# Patient Record
Sex: Male | Born: 1995 | Race: Black or African American | Hispanic: No | Marital: Single | Smoking: Never smoker
Health system: Southern US, Community
[De-identification: ages and names within clinical notes are randomized; demographics above are authoritative.]

---
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
## 2015-09-02 ENCOUNTER — Ambulatory Visit (INDEPENDENT_AMBULATORY_CARE_PROVIDER_SITE_OTHER): Payer: BLUE CROSS/BLUE SHIELD | Admitting: Medical

## 2015-09-02 ENCOUNTER — Encounter: Payer: Self-pay | Admitting: Medical

## 2015-09-02 ENCOUNTER — Other Ambulatory Visit (HOSPITAL_COMMUNITY)
Admission: RE | Admit: 2015-09-02 | Discharge: 2015-09-02 | Disposition: A | Discharge status: Home / Self Care | Payer: BLUE CROSS/BLUE SHIELD | Source: Ambulatory Visit | Attending: Medical | Admitting: Medical

## 2015-09-02 VITALS — BP 116/76 | HR 58 | Temp 98.0°F | Resp 16 | Ht 73.5 in | Wt 193.0 lb

## 2015-09-02 DIAGNOSIS — Z0000 Encounter for general adult medical examination without abnormal findings: Secondary | ICD-10-CM | POA: Diagnosis not present

## 2015-09-02 DIAGNOSIS — Z113 Encounter for screening for infections with a predominantly sexual mode of transmission: Secondary | ICD-10-CM | POA: Insufficient documentation

## 2015-09-02 LAB — POC URINALSYSI DIPSTICK (AUTOMATED)
Bilirubin, UA: NEGATIVE
Blood, UA: NEGATIVE
Glucose, UA: NEGATIVE
Ketones, UA: NEGATIVE
Leukocytes, UA: NEGATIVE
Nitrite, UA: NEGATIVE
Protein, UA: NEGATIVE
Spec Grav, UA: >=1.03
Urobilinogen, UA: 1
pH, UA: 6

## 2015-09-02 LAB — COMPREHENSIVE METABOLIC PANEL
ALT: 29 U/L (ref 0–53)
AST: 23 U/L (ref 0–37)
Albumin: 4.4 g/dL (ref 3.5–5.2)
Alkaline Phosphatase: 45 U/L — ABNORMAL LOW (ref 52–171)
BUN: 19 mg/dL (ref 6–23)
CO2: 30 mEq/L (ref 19–32)
Calcium: 9.4 mg/dL (ref 8.4–10.5)
Chloride: 102 mEq/L (ref 96–112)
Creatinine, Ser: 1.16 mg/dL (ref 0.40–1.50)
GFR: 103.27 mL/min (ref >60.00)
Glucose, Bld: 71 mg/dL (ref 70–99)
Potassium: 3.9 mEq/L (ref 3.5–5.1)
Sodium: 139 mEq/L (ref 135–145)
Total Bilirubin: 0.8 mg/dL (ref 0.2–1.2)
Total Protein: 7.1 g/dL (ref 6.0–8.3)

## 2015-09-02 LAB — CBC WITH DIFFERENTIAL/PLATELET
Basophils Absolute: 0 10*3/uL (ref 0.0–0.1)
Basophils Relative: 0.6 % (ref 0.0–3.0)
Eosinophils Absolute: 0.2 10*3/uL (ref 0.0–0.7)
Eosinophils Relative: 4.6 % (ref 0.0–5.0)
HCT: 44.8 % (ref 36.0–49.0)
Hemoglobin: 15.1 g/dL (ref 12.0–16.0)
Lymphocytes Relative: 38.2 % (ref 24.0–48.0)
Lymphs Abs: 1.7 10*3/uL (ref 0.7–4.0)
MCHC: 33.9 g/dL (ref 31.0–37.0)
MCV: 87 fl (ref 78.0–98.0)
Monocytes Absolute: 0.5 10*3/uL (ref 0.1–1.0)
Monocytes Relative: 11.1 % (ref 3.0–12.0)
Neutro Abs: 2 10*3/uL (ref 1.4–7.7)
Neutrophils Relative %: 45.5 % (ref 43.0–71.0)
Platelets: 252 10*3/uL (ref 150.0–575.0)
RBC: 5.14 Mil/uL (ref 3.80–5.70)
RDW: 14.1 % (ref 11.4–15.5)
WBC: 4.5 10*3/uL (ref 4.5–13.5)

## 2015-09-02 LAB — LIPID PANEL
Cholesterol: 156 mg/dL (ref 0–200)
HDL: 41.6 mg/dL (ref >39.00)
LDL Cholesterol: 85 mg/dL (ref 0–99)
NonHDL: 114.16
Total CHOL/HDL Ratio: 4
Triglycerides: 146 mg/dL (ref 0.0–149.0)
VLDL: 29.2 mg/dL (ref 0.0–40.0)

## 2015-09-02 LAB — TSH: TSH: 2.06 u[IU]/mL (ref 0.40–5.00)

## 2015-09-02 NOTE — Addendum Note (Signed)
Addended by: Eustace QuailEABOLD, Skyelar Swigart J on: 09/02/2015 09:49 AM   Modules accepted: Kipp BroodSmartSet

## 2015-09-02 NOTE — Assessment & Plan Note (Signed)
Will get cbc, cmp, tsh, lipid panel, ua and std screening test. You stopped eating red meat and pork this past year and then restarted this summer. Not bad idea to eliminate those food again. Healthy diet more fruit/vegatables and exercise recommended.

## 2015-09-02 NOTE — Progress Notes (Signed)
Pre visit review using our clinic review tool, if applicable. No additional management support is needed unless otherwise documented below in the visit note. 

## 2015-09-02 NOTE — Patient Instructions (Addendum)
Wellness examination Will get cbc, cmp, tsh, lipid panel, ua and std screening test. You stopped eating red meat and pork this past year and then restarted this summer. Not bad idea to eliminate those food again. Healthy diet more fruit/vegatables and exercise recommended.      Preventive Care for Adults, Male A healthy lifestyle and preventive care can promote health and wellness. Preventive health guidelines for men include the following key practices:  A routine yearly physical is a good way to check with your health care provider about your health and preventative screening. It is a chance to share any concerns and updates on your health and to receive a thorough exam.  Visit your dentist for a routine exam and preventative care every 6 months. Brush your teeth twice a day and floss once a day. Good oral hygiene prevents tooth decay and gum disease.  The frequency of eye exams is based on your age, health, family medical history, use of contact lenses, and other factors. Follow your health care provider's recommendations for frequency of eye exams.  Eat a healthy diet. Foods such as vegetables, fruits, whole grains, low-fat dairy products, and lean protein foods contain the nutrients you need without too many calories. Decrease your intake of foods high in solid fats, added sugars, and salt. Eat the right amount of calories for you.Get information about a proper diet from your health care provider, if necessary.  Regular physical exercise is one of the most important things you can do for your health. Most adults should get at least 150 minutes of moderate-intensity exercise (any activity that increases your heart rate and causes you to sweat) each week. In addition, most adults need muscle-strengthening exercises on 2 or more days a week.  Maintain a healthy weight. The body mass index (BMI) is a screening tool to identify possible weight problems. It provides an estimate of body fat based  on height and weight. Your health care provider can find your BMI and can help you achieve or maintain a healthy weight.For adults 20 years and older:  A BMI below 18.5 is considered underweight.  A BMI of 18.5 to 24.9 is normal.  A BMI of 25 to 29.9 is considered overweight.  A BMI of 30 and above is considered obese.  Maintain normal blood lipids and cholesterol levels by exercising and minimizing your intake of saturated fat. Eat a balanced diet with plenty of fruit and vegetables. Blood tests for lipids and cholesterol should begin at age 10 and be repeated every 5 years. If your lipid or cholesterol levels are high, you are over 50, or you are at high risk for heart disease, you may need your cholesterol levels checked more frequently.Ongoing high lipid and cholesterol levels should be treated with medicines if diet and exercise are not working.  If you smoke, find out from your health care provider how to quit. If you do not use tobacco, do not start.  Lung cancer screening is recommended for adults aged 40-80 years who are at high risk for developing lung cancer because of a history of smoking. A yearly low-dose CT scan of the lungs is recommended for people who have at least a 30-pack-year history of smoking and are a current smoker or have quit within the past 15 years. A pack year of smoking is smoking an average of 1 pack of cigarettes a day for 1 year (for example: 1 pack a day for 30 years or 2 packs a day for  15 years). Yearly screening should continue until the smoker has stopped smoking for at least 15 years. Yearly screening should be stopped for people who develop a health problem that would prevent them from having lung cancer treatment.  If you choose to drink alcohol, do not have more than 2 drinks per day. One drink is considered to be 12 ounces (355 mL) of beer, 5 ounces (148 mL) of wine, or 1.5 ounces (44 mL) of liquor.  Avoid use of street drugs. Do not share needles with  anyone. Ask for help if you need support or instructions about stopping the use of drugs.  High blood pressure causes heart disease and increases the risk of stroke. Your blood pressure should be checked at least every 1-2 years. Ongoing high blood pressure should be treated with medicines, if weight loss and exercise are not effective.  If you are 68-61 years old, ask your health care provider if you should take aspirin to prevent heart disease.  Diabetes screening is done by taking a blood sample to check your blood glucose level after you have not eaten for a certain period of time (fasting). If you are not overweight and you do not have risk factors for diabetes, you should be screened once every 3 years starting at age 65. If you are overweight or obese and you are 47-61 years of age, you should be screened for diabetes every year as part of your cardiovascular risk assessment.  Colorectal cancer can be detected and often prevented. Most routine colorectal cancer screening begins at the age of 64 and continues through age 69. However, your health care provider may recommend screening at an earlier age if you have risk factors for colon cancer. On a yearly basis, your health care provider may provide home test kits to check for hidden blood in the stool. Use of a small camera at the end of a tube to directly examine the colon (sigmoidoscopy or colonoscopy) can detect the earliest forms of colorectal cancer. Talk to your health care provider about this at age 66, when routine screening begins. Direct exam of the colon should be repeated every 5-10 years through age 13, unless early forms of precancerous polyps or small growths are found.  People who are at an increased risk for hepatitis B should be screened for this virus. You are considered at high risk for hepatitis B if:  You were born in a country where hepatitis B occurs often. Talk with your health care provider about which countries are  considered high risk.  Your parents were born in a high-risk country and you have not received a shot to protect against hepatitis B (hepatitis B vaccine).  You have HIV or AIDS.  You use needles to inject street drugs.  You live with, or have sex with, someone who has hepatitis B.  You are a man who has sex with other men (MSM).  You get hemodialysis treatment.  You take certain medicines for conditions such as cancer, organ transplantation, and autoimmune conditions.  Hepatitis C blood testing is recommended for all people born from [birth year] through 1965 and any individual with known risks for hepatitis C.  Practice safe sex. Use condoms and avoid high-risk sexual practices to reduce the spread of sexually transmitted infections (STIs). STIs include gonorrhea, chlamydia, syphilis, trichomonas, herpes, HPV, and human immunodeficiency virus (HIV). Herpes, HIV, and HPV are viral illnesses that have no cure. They can result in disability, cancer, and death.  If you are  a man who has sex with other men, you should be screened at least once per year for:  HIV.  Urethral, rectal, and pharyngeal infection of gonorrhea, chlamydia, or both.  If you are at risk of being infected with HIV, it is recommended that you take a prescription medicine daily to prevent HIV infection. This is called preexposure prophylaxis (PrEP). You are considered at risk if:  You are a man who has sex with other men (MSM) and have other risk factors.  You are a heterosexual man, are sexually active, and are at increased risk for HIV infection.  You take drugs by injection.  You are sexually active with a partner who has HIV.  Talk with your health care provider about whether you are at high risk of being infected with HIV. If you choose to begin PrEP, you should first be tested for HIV. You should then be tested every 3 months for as long as you are taking PrEP.  A one-time screening for abdominal aortic aneurysm  (AAA) and surgical repair of large AAAs by ultrasound are recommended for men ages 33 to 108 years who are current or former smokers.  Healthy men should no longer receive prostate-specific antigen (PSA) blood tests as part of routine cancer screening. Talk with your health care provider about prostate cancer screening.  Testicular cancer screening is not recommended for adult males who have no symptoms. Screening includes self-exam, a health care provider exam, and other screening tests. Consult with your health care provider about any symptoms you have or any concerns you have about testicular cancer.  Use sunscreen. Apply sunscreen liberally and repeatedly throughout the day. You should seek shade when your shadow is shorter than you. Protect yourself by wearing long sleeves, pants, a wide-brimmed hat, and sunglasses year round, whenever you are outdoors.  Once a month, do a whole-body skin exam, using a mirror to look at the skin on your back. Tell your health care provider about new moles, moles that have irregular borders, moles that are larger than a pencil eraser, or moles that have changed in shape or color.  Stay current with required vaccines (immunizations).  Influenza vaccine. All adults should be immunized every year.  Tetanus, diphtheria, and acellular pertussis (Td, Tdap) vaccine. An adult who has not previously received Tdap or who does not know his vaccine status should receive 1 dose of Tdap. This initial dose should be followed by tetanus and diphtheria toxoids (Td) booster doses every 10 years. Adults with an unknown or incomplete history of completing a 3-dose immunization series with Td-containing vaccines should begin or complete a primary immunization series including a Tdap dose. Adults should receive a Td booster every 10 years.  Varicella vaccine. An adult without evidence of immunity to varicella should receive 2 doses or a second dose if he has previously received 1  dose.  Human papillomavirus (HPV) vaccine. Males aged 11-21 years who have not received the vaccine previously should receive the 3-dose series. Males aged 22-26 years may be immunized. Immunization is recommended through the age of 70 years for any male who has sex with males and did not get any or all doses earlier. Immunization is recommended for any person with an immunocompromised condition through the age of 93 years if he did not get any or all doses earlier. During the 3-dose series, the second dose should be obtained 4-8 weeks after the first dose. The third dose should be obtained 24 weeks after the first dose  and 16 weeks after the second dose.  Zoster vaccine. One dose is recommended for adults aged 50 years or older unless certain conditions are present.  Measles, mumps, and rubella (MMR) vaccine. Adults born before [birth year] generally are considered immune to measles and mumps. Adults born in [birth year] or later should have 1 or more doses of MMR vaccine unless there is a contraindication to the vaccine or there is laboratory evidence of immunity to each of the three diseases. A routine second dose of MMR vaccine should be obtained at least 28 days after the first dose for students attending postsecondary schools, health care workers, or international travelers. People who received inactivated measles vaccine or an unknown type of measles vaccine during 1963-1967 should receive 2 doses of MMR vaccine. People who received inactivated mumps vaccine or an unknown type of mumps vaccine before 1979 and are at high risk for mumps infection should consider immunization with 2 doses of MMR vaccine. Unvaccinated health care workers born before [birth year] who lack laboratory evidence of measles, mumps, or rubella immunity or laboratory confirmation of disease should consider measles and mumps immunization with 2 doses of MMR vaccine or rubella immunization with 1 dose of MMR vaccine.  Pneumococcal 13-valent conjugate  (PCV13) vaccine. When indicated, a person who is uncertain of his immunization history and has no record of immunization should receive the PCV13 vaccine. All adults 44 years of age and older should receive this vaccine. An adult aged 48 years or older who has certain medical conditions and has not been previously immunized should receive 1 dose of PCV13 vaccine. This PCV13 should be followed with a dose of pneumococcal polysaccharide (PPSV23) vaccine. Adults who are at high risk for pneumococcal disease should obtain the PPSV23 vaccine at least 8 weeks after the dose of PCV13 vaccine. Adults older than 20 years of age who have normal immune system function should obtain the PPSV23 vaccine dose at least 1 year after the dose of PCV13 vaccine.  Pneumococcal polysaccharide (PPSV23) vaccine. When PCV13 is also indicated, PCV13 should be obtained first. All adults aged 70 years and older should be immunized. An adult younger than age 16 years who has certain medical conditions should be immunized. Any person who resides in a nursing home or long-term care facility should be immunized. An adult smoker should be immunized. People with an immunocompromised condition and certain other conditions should receive both PCV13 and PPSV23 vaccines. People with human immunodeficiency virus (HIV) infection should be immunized as soon as possible after diagnosis. Immunization during chemotherapy or radiation therapy should be avoided. Routine use of PPSV23 vaccine is not recommended for American Indians, [place] Natives, or people younger than 65 years unless there are medical conditions that require PPSV23 vaccine. When indicated, people who have unknown immunization and have no record of immunization should receive PPSV23 vaccine. One-time revaccination 5 years after the first dose of PPSV23 is recommended for people aged 19-64 years who have chronic kidney failure, nephrotic syndrome, asplenia, or immunocompromised conditions.  People who received 1-2 doses of PPSV23 before age 13 years should receive another dose of PPSV23 vaccine at age 2 years or later if at least 5 years have passed since the previous dose. Doses of PPSV23 are not needed for people immunized with PPSV23 at or after age 75 years.  Meningococcal vaccine. Adults with asplenia or persistent complement component deficiencies should receive 2 doses of quadrivalent meningococcal conjugate (MenACWY-D) vaccine. The doses should be obtained at least 2 months apart. Microbiologists working  with certain meningococcal bacteria, [employer], people at risk during an outbreak, and people who travel to or live in countries with a high rate of meningitis should be immunized. A first-year college student up through age 35 years who is living in a residence hall should receive a dose if he did not receive a dose on or after his 16th birthday. Adults who have certain high-risk conditions should receive one or more doses of vaccine.  Hepatitis A vaccine. Adults who wish to be protected from this disease, have chronic liver disease, work with hepatitis A-infected animals, work in hepatitis A research labs, or travel to or work in countries with a high rate of hepatitis A should be immunized. Adults who were previously unvaccinated and who anticipate close contact with an international adoptee during the first 60 days after arrival in the [place] from a country with a high rate of hepatitis A should be immunized.  Hepatitis B vaccine. Adults should be immunized if they wish to be protected from this disease, are under age 22 years and have diabetes, have chronic liver disease, have had more than one sex partner in the past 6 months, may be exposed to blood or other infectious body fluids, are household contacts or sex partners of hepatitis B positive people, are clients or workers in certain care facilities, or travel to or work in countries with a high rate of hepatitis  B.  Haemophilus influenzae type b (Hib) vaccine. A previously unvaccinated person with asplenia or sickle cell disease or having a scheduled splenectomy should receive 1 dose of Hib vaccine. Regardless of previous immunization, a recipient of a hematopoietic stem cell transplant should receive a 3-dose series 6-12 months after his successful transplant. Hib vaccine is not recommended for adults with HIV infection. Preventive Service / Frequency Ages 57 to 60  Blood pressure check.** / Every 3-5 years.  Lipid and cholesterol check.** / Every 5 years beginning at age 58.  Hepatitis C blood test.** / For any individual with known risks for hepatitis C.  Skin self-exam. / Monthly.  Influenza vaccine. / Every year.  Tetanus, diphtheria, and acellular pertussis (Tdap, Td) vaccine.** / Consult your health care provider. 1 dose of Td every 10 years.  Varicella vaccine.** / Consult your health care provider.  HPV vaccine. / 3 doses over 6 months, if 53 or younger.  Measles, mumps, rubella (MMR) vaccine.** / You need at least 1 dose of MMR if you were born in 1957 or later. You may also need a second dose.  Pneumococcal 13-valent conjugate (PCV13) vaccine.** / Consult your health care provider.  Pneumococcal polysaccharide (PPSV23) vaccine.** / 1 to 2 doses if you smoke cigarettes or if you have certain conditions.  Meningococcal vaccine.** / 1 dose if you are age 90 to 4 years and a Market researcher living in a residence hall, or have one of several medical conditions. You may also need additional booster doses.  Hepatitis A vaccine.** / Consult your health care provider.  Hepatitis B vaccine.** / Consult your health care provider.  Haemophilus influenzae type b (Hib) vaccine.** / Consult your health care provider. Ages 81 to 29  Blood pressure check.** / Every year.  Lipid and cholesterol check.** / Every 5 years beginning at age 53.  Lung cancer screening. / Every year if  you are aged 69-80 years and have a 30-pack-year history of smoking and currently smoke or have quit within the past 15 years. Yearly screening is stopped once  you have quit smoking for at least 15 years or develop a health problem that would prevent you from having lung cancer treatment.  Fecal occult blood test (FOBT) of stool. / Every year beginning at age 59 and continuing until age 50. You may not have to do this test if you get a colonoscopy every 10 years.  Flexible sigmoidoscopy** or colonoscopy.** / Every 5 years for a flexible sigmoidoscopy or every 10 years for a colonoscopy beginning at age 37 and continuing until age 51.  Hepatitis C blood test.** / For all people born from [birth year] through 1965 and any individual with known risks for hepatitis C.  Skin self-exam. / Monthly.  Influenza vaccine. / Every year.  Tetanus, diphtheria, and acellular pertussis (Tdap/Td) vaccine.** / Consult your health care provider. 1 dose of Td every 10 years.  Varicella vaccine.** / Consult your health care provider.  Zoster vaccine.** / 1 dose for adults aged 20 years or older.  Measles, mumps, rubella (MMR) vaccine.** / You need at least 1 dose of MMR if you were born in 1957 or later. You may also need a second dose.  Pneumococcal 13-valent conjugate (PCV13) vaccine.** / Consult your health care provider.  Pneumococcal polysaccharide (PPSV23) vaccine.** / 1 to 2 doses if you smoke cigarettes or if you have certain conditions.  Meningococcal vaccine.** / Consult your health care provider.  Hepatitis A vaccine.** / Consult your health care provider.  Hepatitis B vaccine.** / Consult your health care provider.  Haemophilus influenzae type b (Hib) vaccine.** / Consult your health care provider. Ages 91 and over  Blood pressure check.** / Every year.  Lipid and cholesterol check.**/ Every 5 years beginning at age 54.  Lung cancer screening. / Every year if you are aged 50-80 years and have a  30-pack-year history of smoking and currently smoke or have quit within the past 15 years. Yearly screening is stopped once you have quit smoking for at least 15 years or develop a health problem that would prevent you from having lung cancer treatment.  Fecal occult blood test (FOBT) of stool. / Every year beginning at age 80 and continuing until age 25. You may not have to do this test if you get a colonoscopy every 10 years.  Flexible sigmoidoscopy** or colonoscopy.** / Every 5 years for a flexible sigmoidoscopy or every 10 years for a colonoscopy beginning at age 69 and continuing until age 31.  Hepatitis C blood test.** / For all people born from [birth year] through 1965 and any individual with known risks for hepatitis C.  Abdominal aortic aneurysm (AAA) screening.** / A one-time screening for ages 28 to 21 years who are current or former smokers.  Skin self-exam. / Monthly.  Influenza vaccine. / Every year.  Tetanus, diphtheria, and acellular pertussis (Tdap/Td) vaccine.** / 1 dose of Td every 10 years.  Varicella vaccine.** / Consult your health care provider.  Zoster vaccine.** / 1 dose for adults aged 46 years or older.  Pneumococcal 13-valent conjugate (PCV13) vaccine.** / 1 dose for all adults aged 5 years and older.  Pneumococcal polysaccharide (PPSV23) vaccine.** / 1 dose for all adults aged 39 years and older.  Meningococcal vaccine.** / Consult your health care provider.  Hepatitis A vaccine.** / Consult your health care provider.  Hepatitis B vaccine.** / Consult your health care provider.  Haemophilus influenzae type b (Hib) vaccine.** / Consult your health care provider. **Family history and personal history of risk and conditions may change your health care  provider's recommendations.   This information is not intended to replace advice given to you by your health care provider. Make sure you discuss any questions you have with your health care provider.   Document  Released: 03/29/2001 Document Revised: 02/21/2014 Document Reviewed: 06/28/2010 Elsevier Interactive Patient Education [employer].

## 2015-09-02 NOTE — Addendum Note (Signed)
Addended by: Neldon LabellaMABE, Gadiel John S on: 09/02/2015 10:04 AM   Modules accepted: Orders, SmartSet

## 2015-09-02 NOTE — Addendum Note (Signed)
Addended by: Neldon LabellaMABE, HOLDEN S on: 09/02/2015 09:43 AM   Modules accepted: Kipp BroodSmartSet

## 2015-09-02 NOTE — Progress Notes (Signed)
Subjective:    Patient ID: Scott Cantrell, male    DOB: 05/06/1995, 20 y.o.   MRN: [id number]  HPI  I have reviewed pt PMH, PSH, FH, Social History and Surgical History.  Pt states needs check up before school at [employer]. 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 Plays basketball 2 days a week. Pt for a while stopped red meat and pork. Then he restarted beef and pork over summer. Pt soda on occasional at school when needs to stay up for school. Pt does not smoke. No drugs. No alcohol.  Pt not fasting.  HIV screening test may have been done at school. But he is not sure. Pt has one partner. Sometimes where condoms.   Review of Systems  Constitutional: Negative for fever, chills and fatigue.  HENT: Negative for congestion, drooling, ear pain, hearing loss, mouth sores, nosebleeds, postnasal drip, sinus pressure and sneezing.   Respiratory: Negative for cough, chest tightness, shortness of breath and wheezing.   Cardiovascular: Negative for chest pain and palpitations.  Gastrointestinal: Negative for abdominal pain.  Genitourinary: Negative for dysuria, scrotal swelling, difficulty urinating and penile pain.  Musculoskeletal: Negative for back pain.  Skin: Negative for rash.  Neurological: Negative for dizziness and headaches.  Hematological: Negative for adenopathy. Does not bruise/bleed easily.  Psychiatric/Behavioral: Negative for confusion, dysphoric mood and decreased concentration.     No past medical history on file.   Social History   Social History  . Marital Status: Single    Spouse Name: N/A  . Number of Children: N/A  . Years of Education: N/A   Occupational History  . Not on file.   Social History Main Topics  . Smoking status: Never Smoker   . Smokeless tobacco: Not on file  . Alcohol Use: No  . Drug Use: No  . Sexual Activity: Not on file   Other Topics Concern  . Not on file   Social History Narrative   Lives with mom and  older sister. Dad is involved.   Likes basketball.    No past surgical history on file.  Family History  Problem Relation Age of Onset  . Hypertension Mother     No Known Allergies  Current Outpatient Prescriptions on File Prior to Visit  Medication Sig Dispense Refill  . cetirizine (ZYRTEC) 10 MG tablet Take 10 mg by mouth daily.     No current facility-administered medications on file prior to visit.    BP 116/76 mmHg  Pulse 58  Temp(Src) 98 F (36.7 C) (Oral)  Resp 16  Ht 6' 1.5" (1.867 m)  Wt 193 lb (87.544 kg)  BMI 25.12 kg/m2  SpO2 98%       Objective:   Physical Exam  General Mental Status- Alert. General Appearance- Not in acute distress.   Skin General: Color- Normal Color. Moisture- Normal Moisture.  Neck Carotid Arteries- Normal color. Moisture- Normal Moisture. No carotid bruits. No JVD.  Chest and Lung Exam Auscultation: Breath Sounds:-Normal.  Cardiovascular Auscultation:Rythm- Regular. Murmurs & Other Heart Sounds:Auscultation of the heart reveals- No Murmurs.  Abdomen Inspection:-Inspeection Normal. Palpation/Percussion:Note:No mass. Palpation and Percussion of the abdomen reveal- Non Tender, Non Distended + BS, no rebound or guarding.  Neurologic Cranial Nerve exam:- CN III-XII intact(No nystagmus), symmetric smile. Strength:- 5/5 equal and symmetric strength both upper and lower extremities.  Genital- on exam. No hernia felt in inguinal canals. No lesions on penis. No rash.      Assessment & Plan:  Wellness examination  Will get cbc, cmp, tsh, lipid panel, ua and std screening test. You stopped eating red meat and pork this past year and then restarted this summer. Not bad idea to eliminate those food again. Healthy diet more fruit/vegatables and exercise recommended.     Pt notified will call him on lab results.  Follow up date to be determined after lab review.

## 2015-09-03 LAB — RPR

## 2015-09-03 LAB — URINE CYTOLOGY ANCILLARY ONLY
Chlamydia: NEGATIVE
Neisseria Gonorrhea: NEGATIVE
Trichomonas: NEGATIVE

## 2015-09-03 LAB — HIV ANTIBODY (ROUTINE TESTING W REFLEX): HIV 1&2 Ab, 4th Generation: NONREACTIVE

## 2015-09-03 NOTE — Addendum Note (Signed)
Addended by: Neldon LabellaMABE, Kahlil Cowans S on: 09/03/2015 03:08 PM   Modules accepted: K[employer]od[employer]Set

## 2015-09-03 NOTE — Addendum Note (Signed)
Addended by: Neldon LabellaMABE, HOLDEN S on: 09/03/2015 02:26 PM   Modules accepted: [employer]Set

## 2015-09-04 NOTE — Addendum Note (Signed)
Addended by: Neldon LabellaMABE, HOLDEN S on: 09/04/2015 11:47 AM   Modules accepted: [employer]od[employer]Set

## 2015-09-04 NOTE — Addendum Note (Signed)
Addended by: Neldon LabellaMABE, Keiry Kowal S on: 09/04/2015 02:59 PM   Modules accepted: Kipp [employer]odSmartSet

## 2015-12-15 DIAGNOSIS — L308 Other specified dermatitis: Secondary | ICD-10-CM | POA: Diagnosis not present

## 2016-04-29 ENCOUNTER — Ambulatory Visit (INDEPENDENT_AMBULATORY_CARE_PROVIDER_SITE_OTHER): Payer: BLUE CROSS/BLUE SHIELD | Admitting: Medical

## 2016-04-29 ENCOUNTER — Other Ambulatory Visit (HOSPITAL_COMMUNITY)
Admission: RE | Admit: 2016-04-29 | Discharge: 2016-04-29 | Disposition: A | Discharge status: Home / Self Care | Payer: BLUE CROSS/BLUE SHIELD | Source: Ambulatory Visit | Attending: Medical | Admitting: Medical

## 2016-04-29 VITALS — BP 123/64 | HR 50 | Temp 97.9°F | Ht 73.5 in | Wt 198.4 lb

## 2016-04-29 DIAGNOSIS — Z113 Encounter for screening for infections with a predominantly sexual mode of transmission: Secondary | ICD-10-CM | POA: Diagnosis not present

## 2016-04-29 DIAGNOSIS — L739 Follicular disorder, unspecified: Secondary | ICD-10-CM | POA: Diagnosis not present

## 2016-04-29 MED ORDER — DOXYCYCLINE HYCLATE 100 MG PO TABS: 100.0000 mg | ORAL_TABLET | Freq: Two times a day (BID) | ORAL | 0 refills | Status: DC

## 2016-04-29 NOTE — Progress Notes (Signed)
   Subjective:    Patient ID: Scott Cantrell, male    DOB: 07/25/1995, 20 y.o.   MRN: [id number]  HPI  Pt in with some mild bumps in his suprapubic area after he shaved. Not tender but noticed.  No penis lesions. No dc. Pt does have general concern for std. States while since he has been checked. No partner report of any std.   Review of Systems  Constitutional: Negative for chills, fatigue and fever.  Respiratory: Negative for cough, chest tightness, shortness of breath and wheezing.   Cardiovascular: Negative for chest pain and palpitations.  Genitourinary: Negative for dysuria and enuresis.  Musculoskeletal: Negative for back pain.  Skin: Negative for rash.  Neurological: Negative for dizziness and headaches.  Hematological: Negative for adenopathy. Does not bruise/bleed easily.  Psychiatric/Behavioral: Negative for behavioral problems and confusion.   No past medical history on file.   Social History   Social History  . Marital status: Single    Spouse name: N/A  . Number of children: N/A  . Years of education: N/A   Occupational History  . Not on file.   Social History Main Topics  . Smoking status: Never Smoker  . Smokeless tobacco: Not on file  . Alcohol use No  . Drug use: No  . Sexual activity: Not on file   Other Topics Concern  . Not on file   Social History Narrative   Lives with mom and older sister. Dad is involved.   Likes basketball.    No past surgical history on file.  Family History  Problem Relation Age of Onset  . Hypertension Mother     No Known Allergies  No current outpatient prescriptions on file prior to visit.   No current facility-administered medications on file prior to visit.     BP 123/64   Pulse (!) 50   Temp 97.9 F (36.6 C) (Oral)   Ht 6' 1.5" (1.867 m)   Wt 198 lb 6.4 oz (90 kg)   SpO2 94%   BMI 25.82 kg/m       Objective:   Physical Exam  General- No acute distress. Pleasant patient. Neck- Full range  of motion, no jvd Lungs- Clear, even and unlabored. Heart- regular rate and rhythm. Neurologic- CNII- XII grossly intact. Genital- pubic area. Scattered follicle in shaved area. Bumps have hair erupting in center.      Assessment & Plan:  You appear to have folliculitis presently. Rx doxycycline.  Will do std screening test.  If bumps persist refer to derm. Recommend not shaving in region as follicles can get inflammed or infected.  Follow up as 10-14 days or as needed   Asked lab staff if we have hpv serum test please add to lab.   Renzo Vincelette, Ramon DredgeEdward, PA-C

## 2016-04-29 NOTE — Patient Instructions (Signed)
You appear to have folliculitis presently. Rx doxycycline.  Will do std screening test.  If bumps persist refer to derm. Recommend not shaving in region as follicles can get inflammed or infected.  Follow up as 10-14 days or as needed

## 2016-04-29 NOTE — Progress Notes (Signed)
Pre visit review using our clinic review tool, if applicable. No additional management support is needed unless otherwise documented below in the visit note. 

## 2016-04-30 LAB — RPR

## 2016-05-01 LAB — HIV ANTIBODY (ROUTINE TESTING W REFLEX): HIV 1&2 Ab, 4th Generation: NONREACTIVE

## 2016-05-02 LAB — HSV(HERPES SIMPLEX VRS) I + II AB-IGG
HSV 1 Glycoprotein G Ab, IgG: <0.9 Index (ref <0.90)
HSV 2 Glycoprotein G Ab, IgG: <0.9 Index (ref <0.90)

## 2016-05-03 LAB — HSV 1/2 AB (IGM), IFA W/RFLX TITER
HSV 1 IgM Screen: NEGATIVE
HSV 2 IgM Screen: NEGATIVE

## 2016-05-03 LAB — URINE CYTOLOGY ANCILLARY ONLY
Chlamydia: NEGATIVE
Neisseria Gonorrhea: NEGATIVE
Trichomonas: NEGATIVE

## 2016-05-11 ENCOUNTER — Encounter: Payer: Self-pay | Admitting: *Deleted

## 2017-03-20 DIAGNOSIS — H60531 Acute contact otitis externa, right ear: Secondary | ICD-10-CM | POA: Diagnosis not present

## 2017-03-27 DIAGNOSIS — H60531 Acute contact otitis externa, right ear: Secondary | ICD-10-CM | POA: Diagnosis not present

## 2017-03-27 DIAGNOSIS — J069 Acute upper respiratory infection, unspecified: Secondary | ICD-10-CM | POA: Diagnosis not present

## 2017-08-23 ENCOUNTER — Encounter: Payer: BLUE CROSS/BLUE SHIELD | Admitting: Medical

## 2017-08-25 ENCOUNTER — Encounter: Payer: BLUE CROSS/BLUE SHIELD | Admitting: Medical

## 2017-08-25 ENCOUNTER — Encounter: Payer: Self-pay | Admitting: Medical

## 2017-08-25 ENCOUNTER — Other Ambulatory Visit (HOSPITAL_COMMUNITY)
Admission: RE | Admit: 2017-08-25 | Discharge: 2017-08-25 | Disposition: A | Discharge status: Home / Self Care | Payer: BLUE CROSS/BLUE SHIELD | Source: Ambulatory Visit | Attending: Medical | Admitting: Medical

## 2017-08-25 ENCOUNTER — Ambulatory Visit (INDEPENDENT_AMBULATORY_CARE_PROVIDER_SITE_OTHER): Payer: BLUE CROSS/BLUE SHIELD | Admitting: Medical

## 2017-08-25 VITALS — BP 126/76 | HR 51 | Temp 98.2°F | Resp 16 | Ht 74.0 in | Wt 207.8 lb

## 2017-08-25 DIAGNOSIS — Z202 Contact with and (suspected) exposure to infections with a predominantly sexual mode of transmission: Secondary | ICD-10-CM | POA: Insufficient documentation

## 2017-08-25 DIAGNOSIS — Z113 Encounter for screening for infections with a predominantly sexual mode of transmission: Secondary | ICD-10-CM

## 2017-08-25 MED ORDER — DOXYCYCLINE HYCLATE 100 MG PO TABS: 100.0000 mg | ORAL_TABLET | Freq: Two times a day (BID) | ORAL | 0 refills | Status: DC

## 2017-08-25 NOTE — Patient Instructions (Signed)
You have had recent exposure to chlamydia so we will go ahead and treat with doxycycline antibiotic.  Rx advisement given.  We will get STD screening with a urine ancillary studies.  Also will get labs to screen for HIV and syphilis.  We will update you on the results when those are in.  Advised to practice safe sex.  Follow-up as needed.

## 2017-08-25 NOTE — Progress Notes (Signed)
Subjective:    Patient ID: Scott Cantrell, male    DOB: Aug 05, 1995, 22 y.o.   MRN: [id number]  HPI  Pt in for sexually transmitted infection/disease screening. He clarifies not cpe. Declines routine cpe labs.  Pt states his girlfriend got recent + chlaymdia. Pt states he head unprotected sex with her. Pt reports no symptoms on review. Her other screening test were negative   Review of Systems  Constitutional: Negative for chills and fever.  Respiratory: Negative for cough, chest tightness, shortness of breath and wheezing.   Cardiovascular: Negative for chest pain and palpitations.  Gastrointestinal: Negative for abdominal pain.  Genitourinary: Negative for decreased urine volume, dysuria, flank pain, frequency, genital sores, hematuria, penile pain, penile swelling, scrotal swelling and testicular pain.  Musculoskeletal: Negative for back pain.  Skin: Negative for rash.  Hematological: Negative for adenopathy. Does not bruise/bleed easily.   No past medical history on file.   Social History   Socioeconomic History  . Marital status: Single    Spouse name: Not on file  . Number of children: Not on file  . Years of education: Not on file  . Highest education level: Not on file  Occupational History  . Not on file  Social Needs  . Financial resource strain: Not on file  . Food insecurity:    Worry: Not on file    Inability: Not on file  . Transportation needs:    Medical: Not on file    Non-medical: Not on file  Tobacco Use  . Smoking status: Never Smoker  . Smokeless tobacco: Never Used  Substance and Sexual Activity  . Alcohol use: No  . Drug use: No  . Sexual activity: Not on file  Lifestyle  . Physical activity:    Days per week: Not on file    Minutes per session: Not on file  . Stress: Not on file  Relationships  . Social connections:    Talks on phone: Not on file    Gets together: Not on file    Attends religious service: Not on file    Active member  of club or organization: Not on file    Attends meetings of clubs or organizations: Not on file    Relationship status: Not on file  . Intimate partner violence:    Fear of current or ex partner: Not on file    Emotionally abused: Not on file    Physically abused: Not on file    Forced sexual activity: Not on file  Other Topics Concern  . Not on file  Social History Narrative   Lives with mom and older sister. Dad is involved.   Likes basketball.    No past surgical history on file.  Family History  Problem Relation Age of Onset  . Hypertension Mother     No Known Allergies  No current outpatient medications on file prior to visit.   No current facility-administered medications on file prior to visit.     BP 126/76   Pulse (!) 51   Temp 98.2 F (36.8 C) (Oral)   Resp 16   Ht 6\' 2"  (1.88 m)   Wt 207 lb 12.8 oz (94.3 kg)   SpO2 99%   BMI 26.68 kg/m      Objective:   Physical Exam  General- No acute distress. Pleasant patient.  Lungs- Clear, even and unlabored. Heart- regular rate and rhythm. Neurologic- CNII- XII grossly intact.  Genital exam- pt defers genital exam.  Assessment & Plan:  You have had recent exposure to chlamydia so we will go ahead and treat with doxycycline antibiotic.  Rx advisement given.  We will get STD screening with a urine ancillary studies.  Also will get labs to screen for HIV and syphilis.  We will update you on the results when those are in.  Advised to practice safe sex.  Follow-up as needed.  Esperanza RichtersEdward Karilynn Carranza, PA-C

## 2017-08-28 LAB — RPR: RPR Ser Ql: NONREACTIVE

## 2017-08-28 LAB — HIV ANTIBODY (ROUTINE TESTING W REFLEX): HIV 1&2 Ab, 4th Generation: NONREACTIVE

## 2017-08-28 LAB — URINE CYTOLOGY ANCILLARY ONLY
Chlamydia: NEGATIVE
Neisseria Gonorrhea: NEGATIVE
Trichomonas: NEGATIVE

## 2018-12-23 DIAGNOSIS — S8991XA Unspecified injury of right lower leg, initial encounter: Secondary | ICD-10-CM | POA: Diagnosis not present

## 2019-02-14 ENCOUNTER — Ambulatory Visit: Payer: BC Managed Care – PPO | Attending: Internal Medicine

## 2019-02-14 ENCOUNTER — Other Ambulatory Visit: Payer: Self-pay

## 2019-02-14 DIAGNOSIS — Z20822 Contact with and (suspected) exposure to covid-19: Secondary | ICD-10-CM

## 2019-02-14 DIAGNOSIS — Z20828 Contact with and (suspected) exposure to other viral communicable diseases: Secondary | ICD-10-CM | POA: Diagnosis not present

## 2019-02-16 LAB — NOVEL CORONAVIRUS, NAA: SARS-CoV-2, NAA: NOT DETECTED

## 2020-02-11 ENCOUNTER — Other Ambulatory Visit: Payer: BC Managed Care – PPO

## 2020-02-11 ENCOUNTER — Other Ambulatory Visit: Payer: Self-pay

## 2020-02-11 DIAGNOSIS — Z20822 Contact with and (suspected) exposure to covid-19: Secondary | ICD-10-CM | POA: Diagnosis not present

## 2020-02-14 LAB — NOVEL CORONAVIRUS, NAA: SARS-CoV-2, NAA: DETECTED — AB

## 2020-02-14 LAB — SPECIMEN STATUS REPORT

## 2020-02-20 DIAGNOSIS — U071 COVID-19: Secondary | ICD-10-CM | POA: Diagnosis not present

## 2020-09-01 DIAGNOSIS — M9901 Segmental and somatic dysfunction of cervical region: Secondary | ICD-10-CM | POA: Diagnosis not present

## 2020-09-01 DIAGNOSIS — M9902 Segmental and somatic dysfunction of thoracic region: Secondary | ICD-10-CM | POA: Diagnosis not present

## 2020-09-01 DIAGNOSIS — M5414 Radiculopathy, thoracic region: Secondary | ICD-10-CM | POA: Diagnosis not present

## 2020-09-01 DIAGNOSIS — M542 Cervicalgia: Secondary | ICD-10-CM | POA: Diagnosis not present

## 2020-09-01 DIAGNOSIS — M9905 Segmental and somatic dysfunction of pelvic region: Secondary | ICD-10-CM | POA: Diagnosis not present

## 2020-09-01 DIAGNOSIS — M546 Pain in thoracic spine: Secondary | ICD-10-CM | POA: Diagnosis not present

## 2020-09-01 DIAGNOSIS — M2560 Stiffness of unspecified joint, not elsewhere classified: Secondary | ICD-10-CM | POA: Diagnosis not present

## 2020-09-01 DIAGNOSIS — M9903 Segmental and somatic dysfunction of lumbar region: Secondary | ICD-10-CM | POA: Diagnosis not present

## 2020-09-03 ENCOUNTER — Other Ambulatory Visit: Payer: Self-pay | Admitting: Chiropractic Medicine

## 2020-09-03 ENCOUNTER — Ambulatory Visit
Admission: RE | Admit: 2020-09-03 | Discharge: 2020-09-03 | Disposition: A | Discharge status: Home / Self Care | Payer: BC Managed Care – PPO | Source: Ambulatory Visit | Attending: Chiropractic Medicine | Admitting: Chiropractic Medicine

## 2020-09-03 DIAGNOSIS — M9905 Segmental and somatic dysfunction of pelvic region: Secondary | ICD-10-CM | POA: Diagnosis not present

## 2020-09-03 DIAGNOSIS — M549 Dorsalgia, unspecified: Secondary | ICD-10-CM

## 2020-09-03 DIAGNOSIS — M545 Low back pain, unspecified: Secondary | ICD-10-CM | POA: Diagnosis not present

## 2020-09-03 DIAGNOSIS — M542 Cervicalgia: Secondary | ICD-10-CM | POA: Diagnosis not present

## 2020-09-03 DIAGNOSIS — R202 Paresthesia of skin: Secondary | ICD-10-CM | POA: Diagnosis not present

## 2020-09-03 DIAGNOSIS — M9903 Segmental and somatic dysfunction of lumbar region: Secondary | ICD-10-CM | POA: Diagnosis not present

## 2020-09-03 DIAGNOSIS — M5413 Radiculopathy, cervicothoracic region: Secondary | ICD-10-CM | POA: Diagnosis not present

## 2020-09-03 DIAGNOSIS — M9901 Segmental and somatic dysfunction of cervical region: Secondary | ICD-10-CM | POA: Diagnosis not present

## 2020-09-03 DIAGNOSIS — M546 Pain in thoracic spine: Secondary | ICD-10-CM | POA: Diagnosis not present

## 2020-09-03 DIAGNOSIS — M9902 Segmental and somatic dysfunction of thoracic region: Secondary | ICD-10-CM | POA: Diagnosis not present

## 2021-03-05 ENCOUNTER — Other Ambulatory Visit: Payer: Self-pay

## 2021-03-05 ENCOUNTER — Encounter (HOSPITAL_BASED_OUTPATIENT_CLINIC_OR_DEPARTMENT_OTHER): Payer: Self-pay

## 2021-03-05 DIAGNOSIS — I491 Atrial premature depolarization: Secondary | ICD-10-CM | POA: Diagnosis not present

## 2021-03-05 DIAGNOSIS — R002 Palpitations: Secondary | ICD-10-CM | POA: Diagnosis not present

## 2021-03-05 LAB — CBC
HCT: 41.7 % (ref 39.0–52.0)
Hemoglobin: 14.5 g/dL (ref 13.0–17.0)
MCH: 28.9 pg (ref 26.0–34.0)
MCHC: 34.8 g/dL (ref 30.0–36.0)
MCV: 83.2 fL (ref 80.0–100.0)
Platelets: 284 10*3/uL (ref 150–400)
RBC: 5.01 MIL/uL (ref 4.22–5.81)
RDW: 13.2 % (ref 11.5–15.5)
WBC: 7.1 10*3/uL (ref 4.0–10.5)
nRBC: 0 % (ref 0.0–0.2)

## 2021-03-05 LAB — BASIC METABOLIC PANEL
Anion gap: 9 (ref 5–15)
BUN: 12 mg/dL (ref 6–20)
CO2: 24 mmol/L (ref 22–32)
Calcium: 9.4 mg/dL (ref 8.9–10.3)
Chloride: 104 mmol/L (ref 98–111)
Creatinine, Ser: 0.95 mg/dL (ref 0.61–1.24)
GFR, Estimated: >60 mL/min (ref >60)
Glucose, Bld: 114 mg/dL — ABNORMAL HIGH (ref 70–99)
Potassium: 3.9 mmol/L (ref 3.5–5.1)
Sodium: 137 mmol/L (ref 135–145)

## 2021-03-05 LAB — TROPONIN I (HIGH SENSITIVITY): Troponin I (High Sensitivity): <2 ng/L (ref <18)

## 2021-03-05 NOTE — ED Triage Notes (Signed)
Pt c/o chest "discomfort" and palpitations that started today. Denies any associated symptoms.

## 2021-03-06 ENCOUNTER — Emergency Department (HOSPITAL_BASED_OUTPATIENT_CLINIC_OR_DEPARTMENT_OTHER)
Admission: EM | Admit: 2021-03-06 | Discharge: 2021-03-06 | Disposition: A | Discharge status: Home / Self Care | Payer: BC Managed Care – PPO | Attending: Emergency Medicine | Admitting: Emergency Medicine

## 2021-03-06 ENCOUNTER — Emergency Department (HOSPITAL_BASED_OUTPATIENT_CLINIC_OR_DEPARTMENT_OTHER): Payer: BC Managed Care – PPO

## 2021-03-06 DIAGNOSIS — R079 Chest pain, unspecified: Secondary | ICD-10-CM | POA: Diagnosis not present

## 2021-03-06 DIAGNOSIS — R002 Palpitations: Secondary | ICD-10-CM

## 2021-03-06 DIAGNOSIS — I491 Atrial premature depolarization: Secondary | ICD-10-CM

## 2021-03-06 NOTE — ED Provider Notes (Signed)
[facility] EMERGENCY DEPT Provider Note   CSN: [id number] Arrival date & time: 03/05/21  2242     History  Chief Complaint  Patient presents with   Chest Pain    Scott Cantrell is a 26 y.o. male.  26 yo m without signficant pmh who presents to the ED with palpitations. Has been going on for a couple days but tonight he had an episode where he layed down and it was more pronounced and had multiple abnormal/skipped beats in a row over about a minute. Otherwise will just have one once in awhile.  Had coffee last couple days where he normally does not but not a significant amount.  No other changes in his diet.  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  No changes in his exercise pattern.  No trauma.  No over-the-counter medications aside from occasional Motrin.   Chest Pain     Home Medications Prior to Admission medications   Medication Sig Start Date End Date Taking? Authorizing Provider  doxycycline (VIBRA-TABS) 100 MG tablet Take 1 tablet (100 mg total) by mouth 2 (two) times daily. Caps or generic 08/25/17   Saguier, Ramon Dredge, PA-C      Allergies    Patient has no known allergies.    Review of Systems   Review of Systems  Cardiovascular:  Positive for chest pain.   Physical Exam Updated Vital Signs BP 130/82    Pulse (!) 51    Temp 98 F (36.7 C) (Oral)    Resp 16    Ht 6\' 2"  (1.88 m)    Wt 97.5 kg    SpO2 100%    BMI 27.60 kg/m  Physical Exam Vitals and nursing note reviewed.  Constitutional:      Appearance: He is well-developed.  HENT:     Head: Normocephalic and atraumatic.  Cardiovascular:     Rate and Rhythm: Normal rate. No extrasystoles are present.    Chest Wall: PMI is not displaced.     Heart sounds: Normal heart sounds.  Pulmonary:     Effort: Pulmonary effort is normal. No respiratory distress.  Chest:     Chest wall: No mass or  deformity.  Abdominal:     General: There is no distension.     Palpations: Abdomen is soft.  Musculoskeletal:        General: Normal range of motion.     Cervical back: Normal range of motion.     Right lower leg: No edema.     Left lower leg: No edema.  Neurological:     Mental Status: He is alert.    ED Results / Procedures / Treatments   Labs (all labs ordered are listed, but only abnormal results are displayed) Labs Reviewed  BASIC METABOLIC PANEL - Abnormal; Notable for the following components:      Result Value   Glucose, Bld 114 (*)    All other components within normal limits  CBC  TROPONIN I (HIGH SENSITIVITY)    EKG None  Radiology DG Chest Port 1 View  Result Date: 03/06/2021 CLINICAL DATA:  Chest pain, palpitations EXAM: PORTABLE CHEST 1 VIEW COMPARISON:  11/15/2007 FINDINGS: The heart size and mediastinal contours are within normal limits. Both lungs are clear. The visualized skeletal structures are unremarkable. IMPRESSION: No active disease. Electronically Signed   By: 01/15/2008 M.D.   On:  03/06/2021 02:14    Procedures .1-3 Lead EKG Interpretation Performed by: Marily Memos, MD Authorized by: Marily Memos, MD     Interpretation: normal     ECG rate:  58   ECG rate assessment: normal     Rhythm: sinus rhythm     Ectopy: PAC     Conduction: normal     Medications Ordered in ED Medications - No data to display  ED Course/ Medical Decision Making/ A&P                           Medical Decision Making Amount and/or Complexity of Data Reviewed Labs: ordered.   Patient deftly has some premature atrial contractions on monitor while was in the room.  No prolonged atrial arrhythmias.  I discussed with him stopping the caffeine again and if this did not help he might need see a cardiologist to rule out any kind of arrhythmias.  We will take some aspirin daily until that time.  Low suspicion for V. tach/V. fib.  He has no exertional symptoms to  suggest structural heart disease.  Electrolytes within normal limits I doubt those as a cause.  No evidence of dehydration.  Final Clinical Impression(s) / ED Diagnoses Final diagnoses:  Palpitation  PAC (premature atrial contraction)    Rx / DC Orders ED Discharge Orders          Ordered    Ambulatory referral to Cardiology        03/06/21 0318              Yacob Wilkerson, Barbara Cower, MD 03/06/21 0413

## 2021-03-18 ENCOUNTER — Other Ambulatory Visit: Payer: Self-pay

## 2021-03-18 ENCOUNTER — Encounter: Payer: Self-pay | Admitting: Cardiovascular Disease

## 2021-03-18 ENCOUNTER — Ambulatory Visit: Payer: BC Managed Care – PPO | Admitting: Cardiovascular Disease

## 2021-03-18 VITALS — BP 124/78 | HR 55 | Ht 74.0 in | Wt 221.2 lb

## 2021-03-18 DIAGNOSIS — R002 Palpitations: Secondary | ICD-10-CM

## 2021-03-18 NOTE — Patient Instructions (Signed)
Medication Instructions:  No changes *If you need a refill on your cardiac medications before your next appointment, please call your pharmacy*   Lab Work: None ordered If you have labs (blood work) drawn today and your tests are completely normal, you will receive your results only by: MyChart Message (if you have MyChart) OR A paper copy in the mail If you have any lab test that is abnormal or we need to change your treatment, we will call you to review the results.   Testing/Procedures: None ordered   Follow-Up: At [facility], you and your health needs are our priority.  As part of our continuing mission to provide you with exceptional heart care, we have created designated Provider Care Teams.  These Care Teams include your primary Cardiologist (physician) and Advanced Practice Providers (APPs -  Physician Assistants and Nurse Practitioners) who all work together to provide you with the care you need, when you need it.  We recommend signing up for the patient portal called "MyChart".  Sign up information is provided on this After Visit Summary.  MyChart is used to connect with patients for Virtual Visits (Telemedicine).  Patients are able to view lab/test results, encounter notes, upcoming appointments, etc.  Non-urgent messages can be sent to your provider as well.   To learn more about what you can do with MyChart, go to [email or web address].    Your next appointment:   Follow up as needed with Dr. Royann Shivers  Other Instructions  [employer]: Website: [email or web address]  DR. Royann Shivers RECOMMENDS YOU PURCHASE  " [employer] FROM THE  [employer]  APP PLAY STORE.  THE APP IS FREE , BUT THE  EQUIPMENT HAS A COST. IT ALLOWS YOU TO OBTAIN A RECORDING OF YOUR HEART RATE AND RHYTHM BY PROVIDING A SHORT STRIP THAT YOU CAN SHARE WITH YOUR PROVIDER.

## 2021-03-18 NOTE — Progress Notes (Signed)
Cardiology Office Note:    Date:  03/19/2021   ID:  Scott Cantrell, DOB [birth year], MRN [id number]  PCP:  Pcp, No   [facility] Providers Cardiologist:  None     Referring MD: Merrily Pew, MD   Chief Complaint  Patient presents with   Palpitations    History of Present Illness:    Scott Cantrell is a 26 y.o. male with a hx of good health and overall physical fitness who had a period of frequent palpitations that occurred about 2 weeks ago.  He recalls being tired around that time and having to drink coffee and sodas to stay awake and found that these made his palpitations worse.  His heart was beating "crazy irregular" and "a little fast".  In the meantime, the palpitations have subsided completely and he has not had any in the last 10 to 12 days.  He has a smart watch, but does not know if it has an ECG feature.  The battery is dead today and we were not able to really figure that out.  He was seen in the emergency room for these complaints on 03/06/2021.  The work-up was benign, including a normal ECG, chemistries and high-sensitivity troponin.  Reportedly, telemetry showed rare PACs..  He has a very physical job and did not have palpitations while doing physical exertion.  He has never experienced syncope or near syncope.  He denies chest pain or shortness of breath with physical activity and thinks that he is in good shape.  He gets a total of about 8 hours of sleep a day, although due to his work schedule only about 5-6 hours of sleep happen at night and he takes a nap later in the day.  He does not have problems with edema, claudication, erectile dysfunction or focal neurological complaints.  He denies any other recent intercurrent illness with fever, chills cough or other respiratory symptoms.  He does not use recreational drugs and has not taking any over-the-counter decongestant medications.  There is no family history of early unexplained death, cardiac arrest, syncope or  premature heart disease.  His grandmother had a stroke in her 47s and had high blood pressure, but all his other relatives are quite healthy.  His electrocardiogram shows sinus bradycardia and he has prominent respiratory arrhythmia of youth.  There is a pattern of early repolarization on his ECG, but this is otherwise normal and the QTC is 401 ms.  History reviewed. No pertinent past medical history.  History reviewed. No pertinent surgical history.  Current Medications: No outpatient medications have been marked as taking for the 03/18/21 encounter (Office Visit) with Sanda Klein, MD.     Allergies:   Patient has no known allergies.   Social History   Socioeconomic History   Marital status: Single    Spouse name: Not on file   Number of children: Not on file   Years of education: Not on file   Highest education level: Not on file  Occupational History   Not on file  Tobacco Use   Smoking status: Never   Smokeless tobacco: Never  Vaping Use   Vaping Use: Never used  Substance and Sexual Activity   Alcohol use: No   Drug use: No   Sexual activity: Not on file  Other Topics Concern   Not on file  Social History Narrative   Lives with mom and older sister. Dad is involved.   Likes basketball.   Social Determinants of Health  Financial Resource Strain: Not on file  Food Insecurity: Not on file  Transportation Needs: Not on file  Physical Activity: Not on file  Stress: Not on file  Social Connections: Not on file     Family History: The patient's family history includes Hypertension in his mother.  ROS:   Please see the history of present illness.     All other systems reviewed and are negative.  EKGs/Labs/Other Studies Reviewed:    The following studies were reviewed today:   EKG:  EKG is  ordered today.  The ekg ordered today demonstrates sinus bradycardia at 55 bpm, early repolarization pattern, otherwise normal tracing.  QTc 401 ms.  Recent  Labs: 03/05/2021: BUN 12; Creatinine, Ser 0.95; Hemoglobin 14.5; Platelets 284; Potassium 3.9; Sodium 137  Recent Lipid Panel    Component Value Date/Time   CHOL 156 09/02/2015 0948   TRIG 146.0 09/02/2015 0948   HDL 41.60 09/02/2015 0948   CHOLHDL 4 09/02/2015 0948   VLDL 29.2 09/02/2015 0948   LDLCALC 85 09/02/2015 0948     Risk Assessment/Calculations:           Physical Exam:    VS:  BP 124/78    Pulse (!) 55    Ht 6\' 2"  (1.88 m)    Wt 221 lb 3.2 oz (100.3 kg)    SpO2 99%    BMI 28.40 kg/m     Wt Readings from Last 3 Encounters:  03/18/21 221 lb 3.2 oz (100.3 kg)  03/05/21 215 lb (97.5 kg)  08/25/17 207 lb 12.8 oz (94.3 kg)     GEN: He appears very fit, even athletic,, although he is mildly overweight, well nourished, well developed in no acute distress HEENT: Normal NECK: No JVD; No carotid bruits LYMPHATICS: No lymphadenopathy CARDIAC: RRR, no murmurs, rubs, gallops RESPIRATORY:  Clear to auscultation without rales, wheezing or rhonchi  ABDOMEN: Soft, non-tender, non-distended MUSCULOSKELETAL:  No edema; No deformity  SKIN: Warm and dry NEUROLOGIC:  Alert and oriented x 3 PSYCHIATRIC:  Normal affect   ASSESSMENT:    1. Palpitations    PLAN:    In order of problems listed above:  Palpitations possibly related to premature atrial contractions which were noted reportedly during telemetry at the time of his ED visit.  His ECG is normal.  His physical exam is unremarkable.  He does not have any chronic illnesses or high risk family history.  Since his symptoms have subsided, it is unlikely that arrhythmia monitoring would be helpful.  Hopefully, his smart watch has ECG capability and if he has recurrent symptoms he can send me a MyChart message with symptomatic recordings.  If he does not have the appropriate type of smart watch, we can set him up for a formal event monitor if symptoms recur.  Advised staying away from excessive caffeine and other stimulants.            Medication Adjustments/Labs and Tests Ordered: Current medicines are reviewed at length with the patient today.  Concerns regarding medicines are outlined above.  Orders Placed This Encounter  Procedures   EKG 12-Lead   No orders of the defined types were placed in this encounter.   Patient Instructions  Medication Instructions:  No changes *If you need a refill on your cardiac medications before your next appointment, please call your pharmacy*   Lab Work: None ordered If you have labs (blood work) drawn today and your tests are completely normal, you will receive your results only by:  MyChart Message (if you have MyChart) OR A paper copy in the mail If you have any lab test that is abnormal or we need to change your treatment, we will call you to review the results.   Testing/Procedures: None ordered   Follow-Up: At [facility], you and your health needs are our priority.  As part of our continuing mission to provide you with exceptional heart care, we have created designated Provider Care Teams.  These Care Teams include your primary Cardiologist (physician) and Advanced Practice Providers (APPs -  Physician Assistants and Nurse Practitioners) who all work together to provide you with the care you need, when you need it.  We recommend signing up for the patient portal called "MyChart".  Sign up information is provided on this After Visit Summary.  MyChart is used to connect with patients for Virtual Visits (Telemedicine).  Patients are able to view lab/test results, encounter notes, upcoming appointments, etc.  Non-urgent messages can be sent to your provider as well.   To learn more about what you can do with MyChart, go to [email or web address].    Your next appointment:   Follow up as needed with Dr. Sallyanne Kuster  Other Instructions  [employer]: Website: [email or web address]  DR. Sallyanne Kuster RECOMMENDS YOU PURCHASE  " [employer]" [employer]   [employer] FROM THE  [employer]  APP PLAY STORE.  THE APP IS FREE , BUT THE  EQUIPMENT HAS A COST. IT ALLOWS YOU TO OBTAIN A RECORDING OF YOUR HEART RATE AND RHYTHM BY PROVIDING A SHORT STRIP THAT YOU CAN SHARE WITH YOUR PROVIDER.       Signed, Sanda Klein, MD  03/19/2021 9:17 AM    [facility]

## 2021-03-19 ENCOUNTER — Encounter: Payer: Self-pay | Admitting: Cardiovascular Disease

## 2021-04-21 NOTE — Progress Notes (Signed)
? ? ?Subjective:   ? ?CC: L knee pain ? ?I, Scott Cantrell, LAT, ATC, am serving as scribe for Dr. Clementeen Graham. ? ?HPI: Pt is a 26 y/o male presenting w/ c/o L knee pain since 04/19/21 when he fell while playing basketball and landed awkwardly on his L leg w/ his knee in a valgus position.  He locates his pain to his L lateral knee just distal to the knee joint. ? ?L knee swelling: yes initially but not currently ?L knee mechanical symptoms: no ?Aggravating factors: walking/weight-bearing activity; putting on his shoes; seated FABER/Fig 4 position ?Treatments tried: ice; IBU; elevation ? ?Pertinent review of Systems: No fevers or chills ? ?Relevant historical information: History of palpitations.  Works doing delivery. ? ? ?Objective:   ? ?Vitals:  ? 04/22/21 0951  ?BP: 110/80  ?Pulse: (!) 53  ?SpO2: 96%  ? ?General: Well Developed, well nourished, and in no acute distress.  ? ?MSK: Left knee: Normal. ?Mildly tender palpation anterior lateral left knee. ?Normal knee motion. ?Stable ligamentous exam.  Some pain with anterior drawer test and lateral collateral ligament stress test without laxity. ?McMurray's test is negative but some pain is felt with the lateral McMurray's test.  No palpable pop. ?Intact strength. ?Pulses cap refill and sensation are intact distally. ? ?Lab and Radiology Results ? ?Diagnostic Limited MSK Ultrasound of: Left knee ?Quad tendon intact normal-appearing ?Trace joint effusion present superior patellar space. ?Patellar tendon slightly thickened near proximal patellar origin.  Otherwise normal-appearing ?Medial knee joint line is normal-appearing ?Lateral knee normal-appearing joint line with no visible meniscus tear or abnormality of the LCL. ?Bony structures normal-appearing ?Impression: Trace joint effusion and slightly thickened patellar tendon but otherwise normal-appearing knee. ? ?X-ray images left knee obtained today personally and independently interpreted. ?Mild medial DJD.  Mild  patellofemoral DJD.  No acute fractures. ?Await formal radiology review ? ? ? ?Impression and Recommendations:   ? ?Assessment and Plan: ?26 y.o. male with left knee pain after an injury playing basketball.  He is feeling a lot better 3 days later and his x-ray looks largely normal and his ultrasound looks largely normal.  His physical exam is mostly reassuring today.  Plan for some watchful waiting with Voltaren gel and return to activity.  However if he has more mechanical symptoms or is unable to to return to work normally or has worsening pain would recommend either injection or MRI.  He will let me know.  Work note provided until either Monday or Tuesday of next week.. ? ?PDMP not reviewed this encounter. ?Orders Placed This Encounter  ?Procedures  ? [place] LIMITED JOINT SPACE STRUCTURES LOW LEFT(NO LINKED CHARGES)  ?  Order Specific Question:   Reason for Exam (SYMPTOM  OR DIAGNOSIS REQUIRED)  ?  Answer:   L knee pain  ?  Order Specific Question:   Preferred imaging location?  ?  Answer:   [facility]  ? DG Knee AP/LAT W/Sunrise Left  ?  Standing Status:   Future  ?  Number of Occurrences:   1  ?  Standing Expiration Date:   05/23/2021  ?  Order Specific Question:   Reason for Exam (SYMPTOM  OR DIAGNOSIS REQUIRED)  ?  Answer:   L knee pain  ?  Order Specific Question:   Preferred imaging location?  ?  Answer:   [facility]  ? ?No orders of the defined types were placed in this encounter. ? ? ?Discussed warning signs or symptoms.  Please see discharge instructions. Patient expresses understanding. ? ? ?The above documentation has been reviewed and is accurate and complete Clementeen Graham, M.D. ? ?

## 2021-04-22 ENCOUNTER — Ambulatory Visit (INDEPENDENT_AMBULATORY_CARE_PROVIDER_SITE_OTHER): Payer: BC Managed Care – PPO | Admitting: Family Medicine

## 2021-04-22 ENCOUNTER — Other Ambulatory Visit: Payer: Self-pay

## 2021-04-22 ENCOUNTER — Encounter: Payer: Self-pay | Admitting: Family Medicine

## 2021-04-22 ENCOUNTER — Ambulatory Visit: Payer: Self-pay

## 2021-04-22 ENCOUNTER — Ambulatory Visit (INDEPENDENT_AMBULATORY_CARE_PROVIDER_SITE_OTHER): Payer: BC Managed Care – PPO

## 2021-04-22 VITALS — BP 110/80 | HR 53 | Ht 74.0 in | Wt 227.0 lb

## 2021-04-22 DIAGNOSIS — M25562 Pain in left knee: Secondary | ICD-10-CM

## 2021-04-22 NOTE — Patient Instructions (Addendum)
Nice to meet you today. ? ?Please use Voltaren gel (Generic Diclofenac Gel) up to 4x daily for pain as needed.  This is available over-the-counter as both the name brand Voltaren gel and the generic diclofenac gel.  ? ?Please get an Xray today before you leave. ? ?Follow-up: as needed ?

## 2021-04-23 NOTE — Progress Notes (Signed)
Left knee x-ray does not show any acute abnormalities.  The kneecap sits a little bit high in the knee but otherwise looks okay.

## 2021-04-26 ENCOUNTER — Encounter: Payer: Self-pay | Admitting: Physical Therapy

## 2021-04-26 ENCOUNTER — Telehealth: Payer: Self-pay | Admitting: Family Medicine

## 2021-04-26 NOTE — Telephone Encounter (Signed)
Letter emailed to pt.

## 2021-04-26 NOTE — Telephone Encounter (Signed)
Pt ready to Return to Work. Needs note to rtn to full duty with no restrictions eff 04/27/2021. ? ?Requests if be emailed to [place] address on file. ?

## 2021-04-26 NOTE — Telephone Encounter (Signed)
Letter written and placed up front in bin.  Please email to pt per phone note. ?

## 2021-05-19 DIAGNOSIS — Z789 Other specified health status: Secondary | ICD-10-CM | POA: Diagnosis not present

## 2021-12-29 IMAGING — CR DG CERVICAL SPINE 2 OR 3 VIEWS
3 series · 3 of 3 positions shown · non-contrast
Comparison: None.

CLINICAL DATA: Midline neck pain.

EXAM:
CERVICAL SPINE - 2-3 VIEW

[w cervical spine lat]
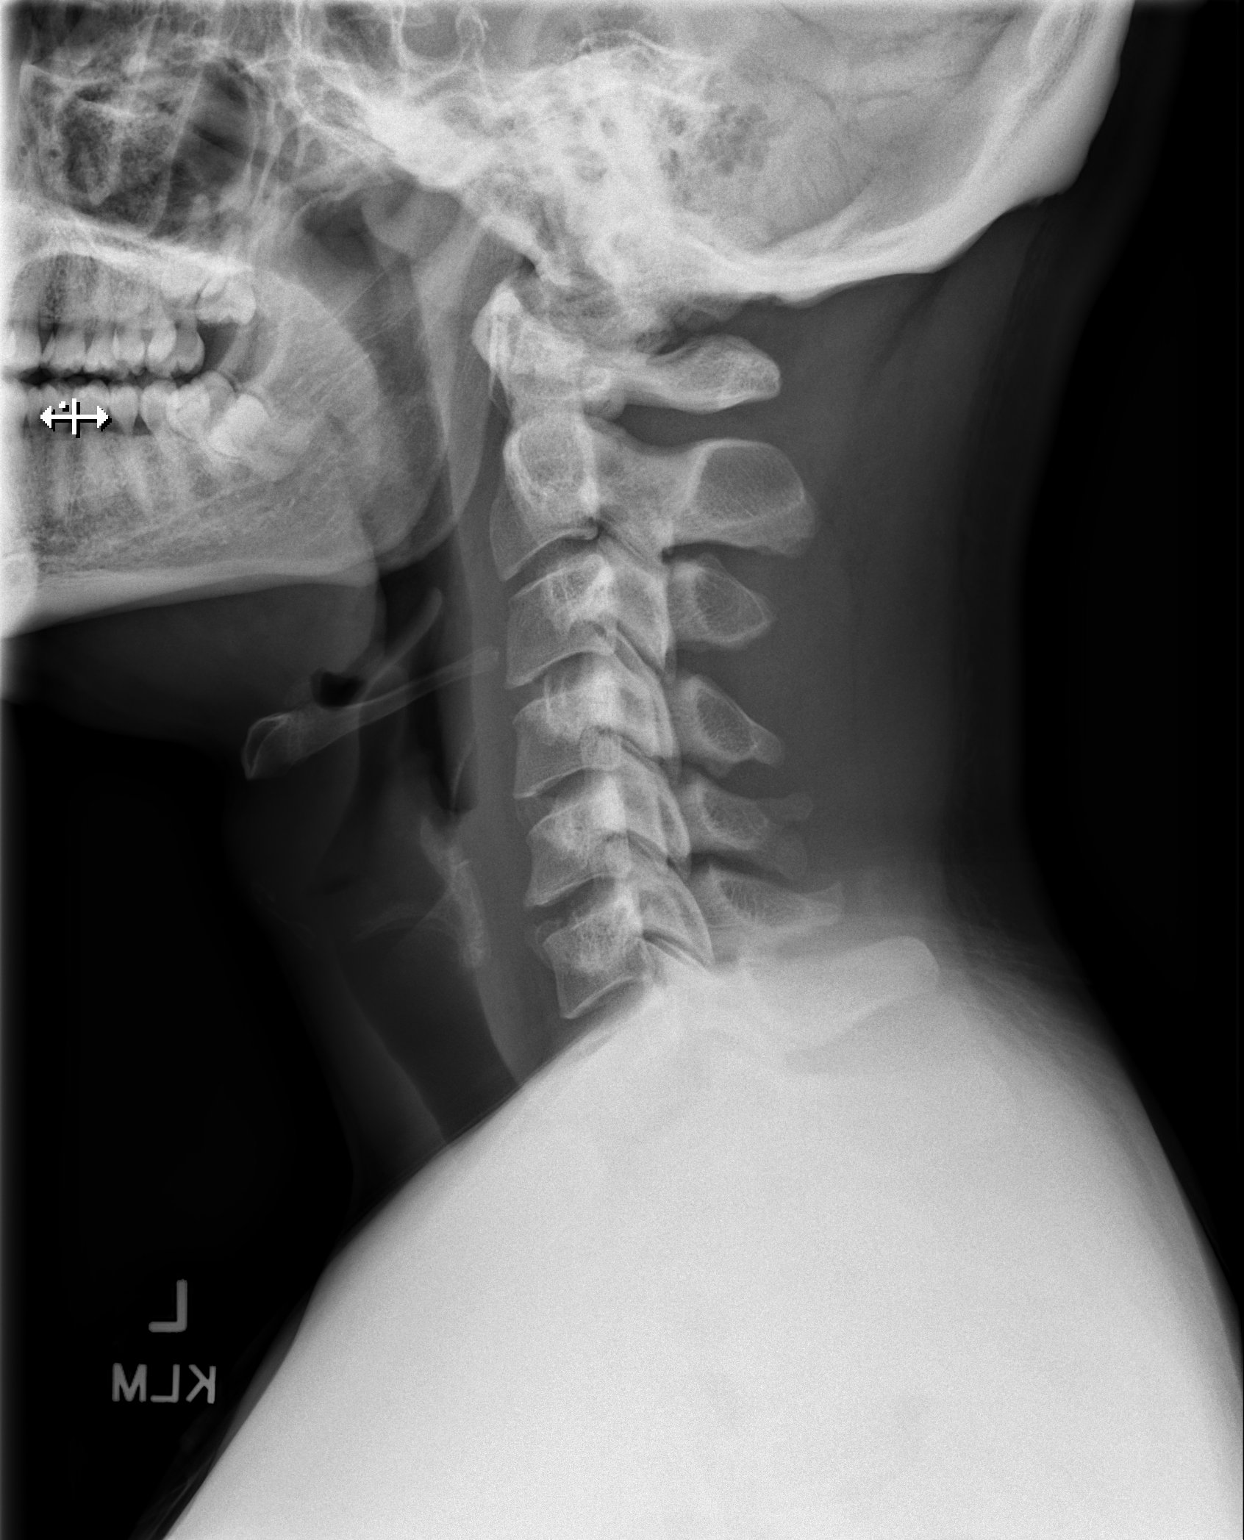

[w cervical spine ap]
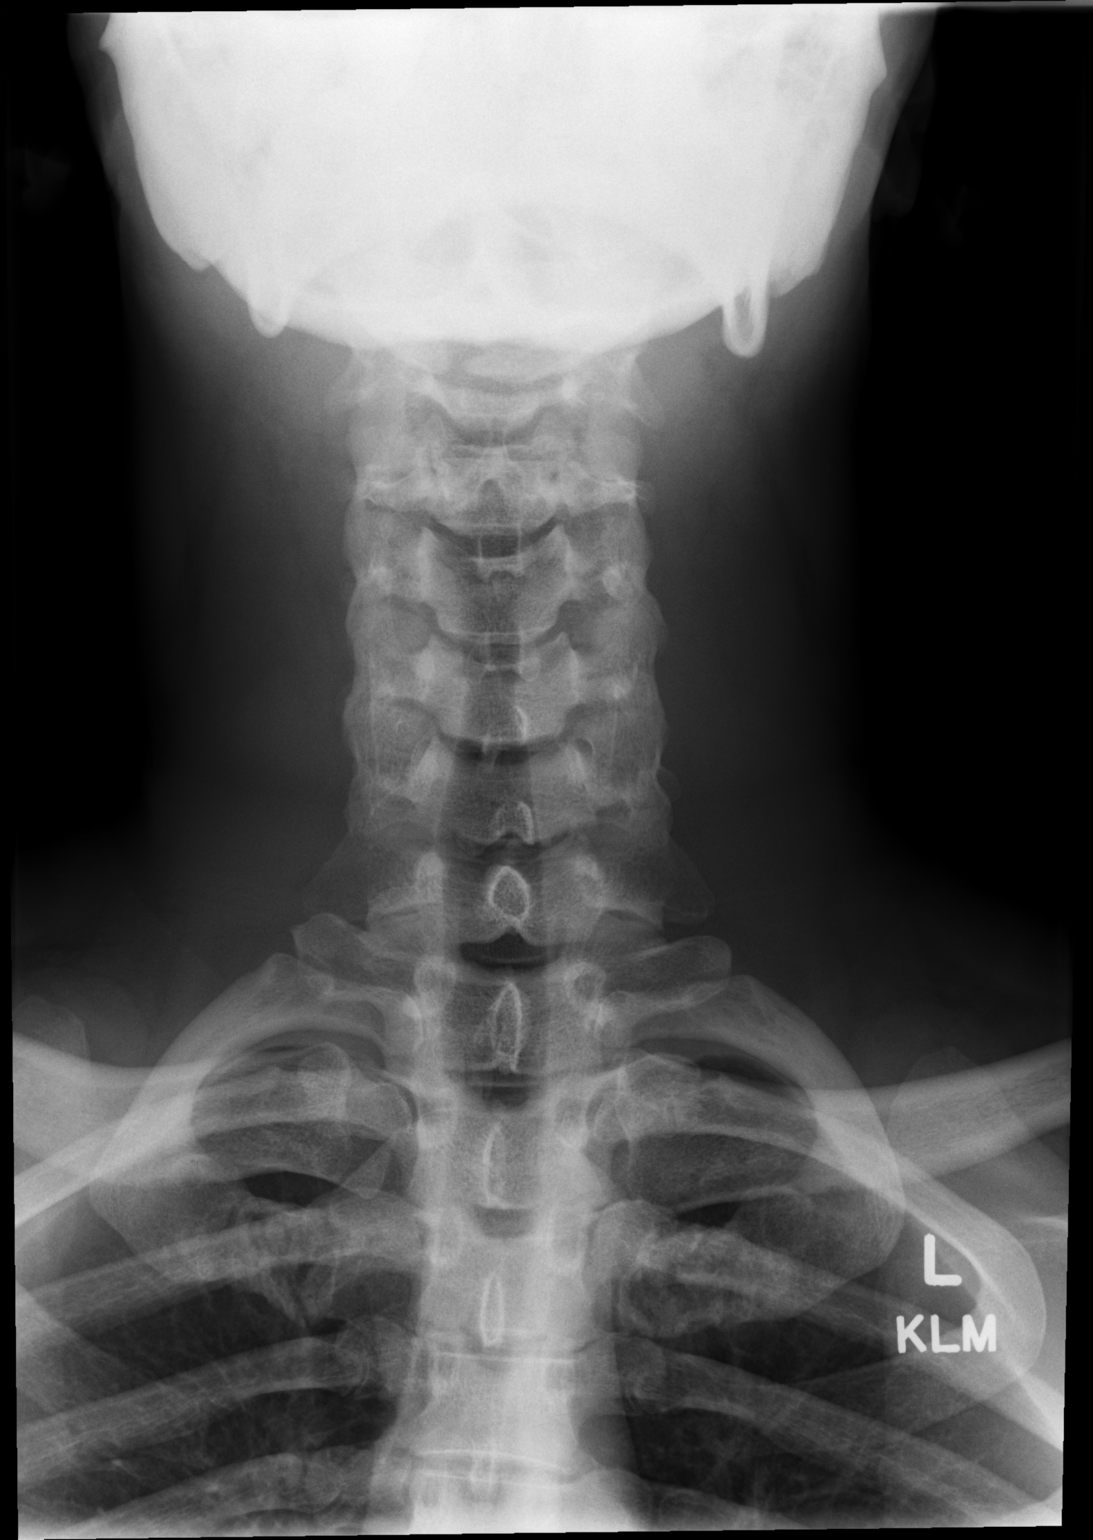

[w cervical spine odontoid]
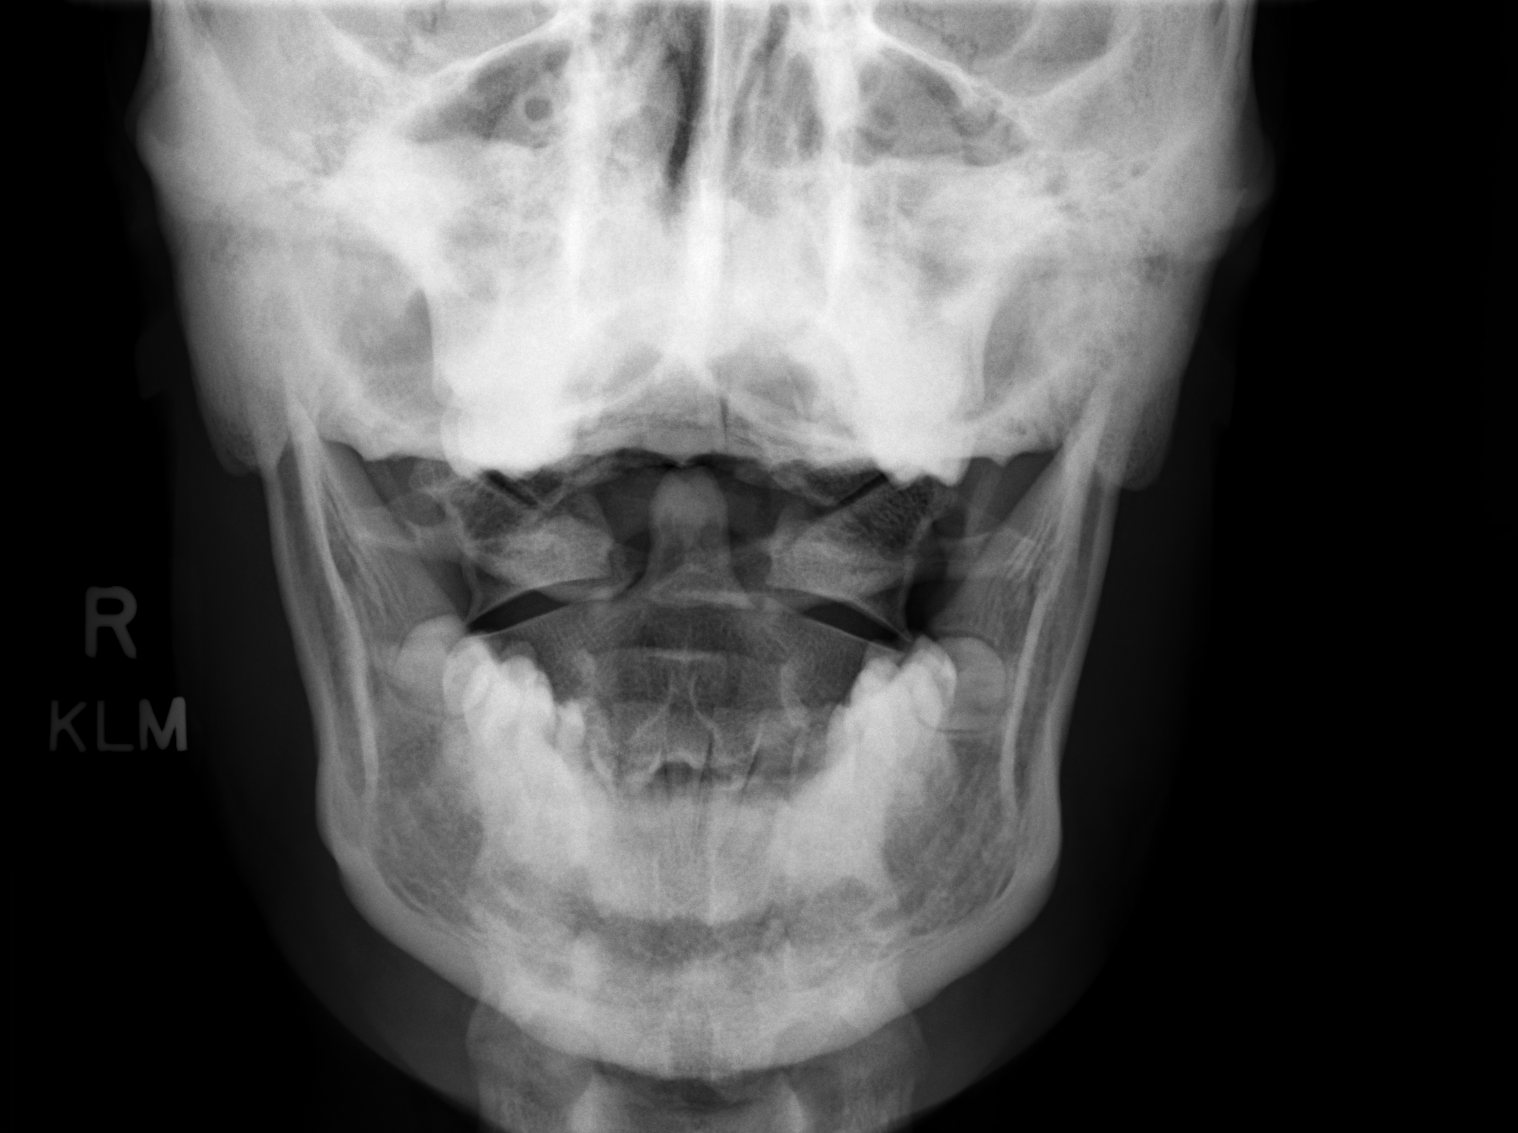

[3 of 3 positions shown; findings below may reference images not displayed]

FINDINGS: The lateral view is diagnostic to the C6-C7 level.

There is no acute fracture or subluxation. Vertebral body heights
are preserved.

Alignment is normal.

Interveterbral disc spaces are maintained.

Normal prevertebral soft tissues.
IMPRESSION: Negative.

## 2021-12-29 IMAGING — CR DG PELVIS 1-2V
1 series · 1 of 1 positions shown · non-contrast
Comparison: None.

CLINICAL DATA: Low back pain.

EXAM:
PELVIS - 1-2 VIEW

[w pelvis upright]
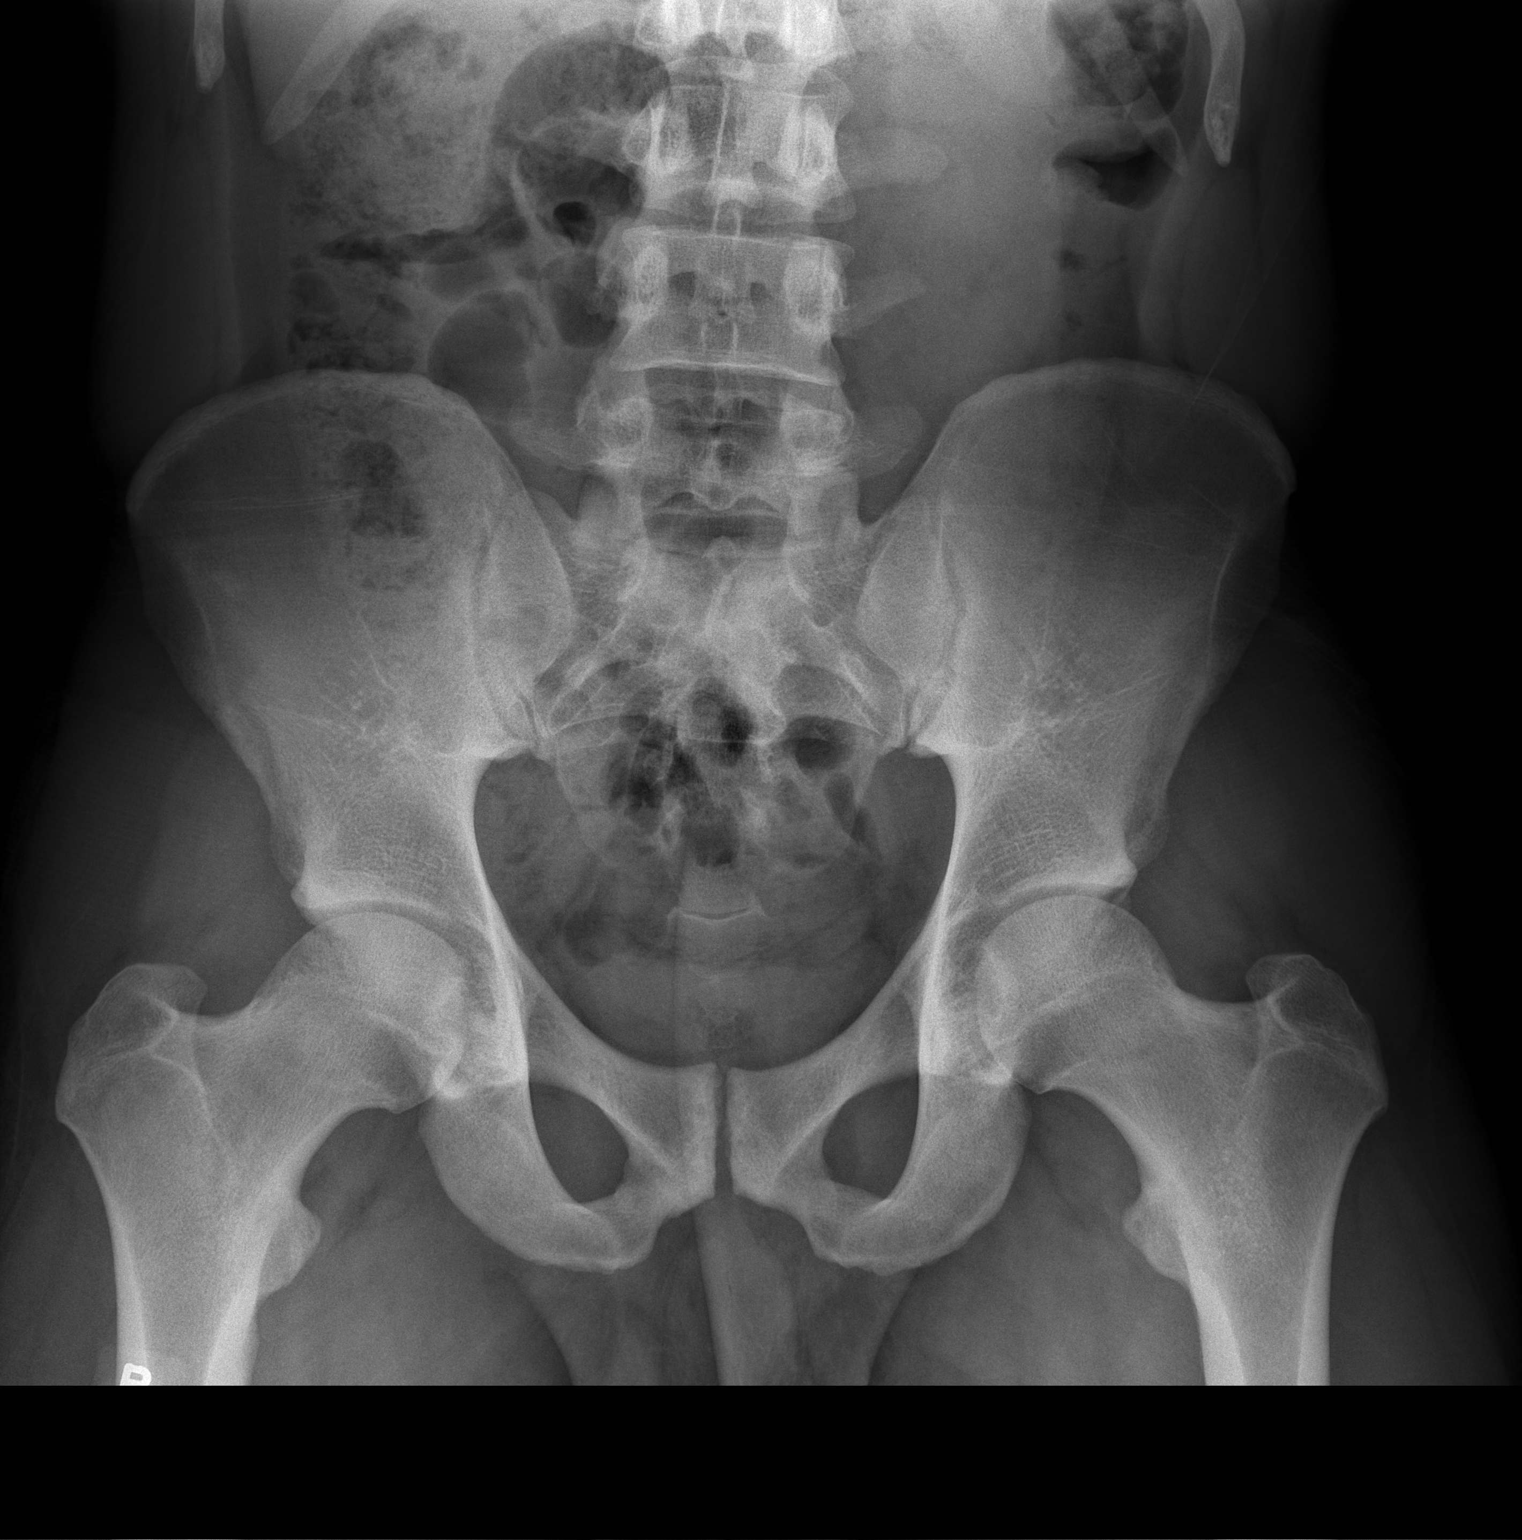

[1 of 1 positions shown; findings below may reference images not displayed]

FINDINGS: There is no evidence of pelvic fracture or diastasis. No pelvic bone
lesions are seen.
IMPRESSION: Negative.

## 2021-12-29 IMAGING — CR DG THORACIC SPINE 3V
4 series · 4 of 4 positions shown · non-contrast
Comparison: None.

CLINICAL DATA: Midline back pain.

EXAM:
THORACIC SPINE - 3 VIEWS

[w thoracic swimmers]
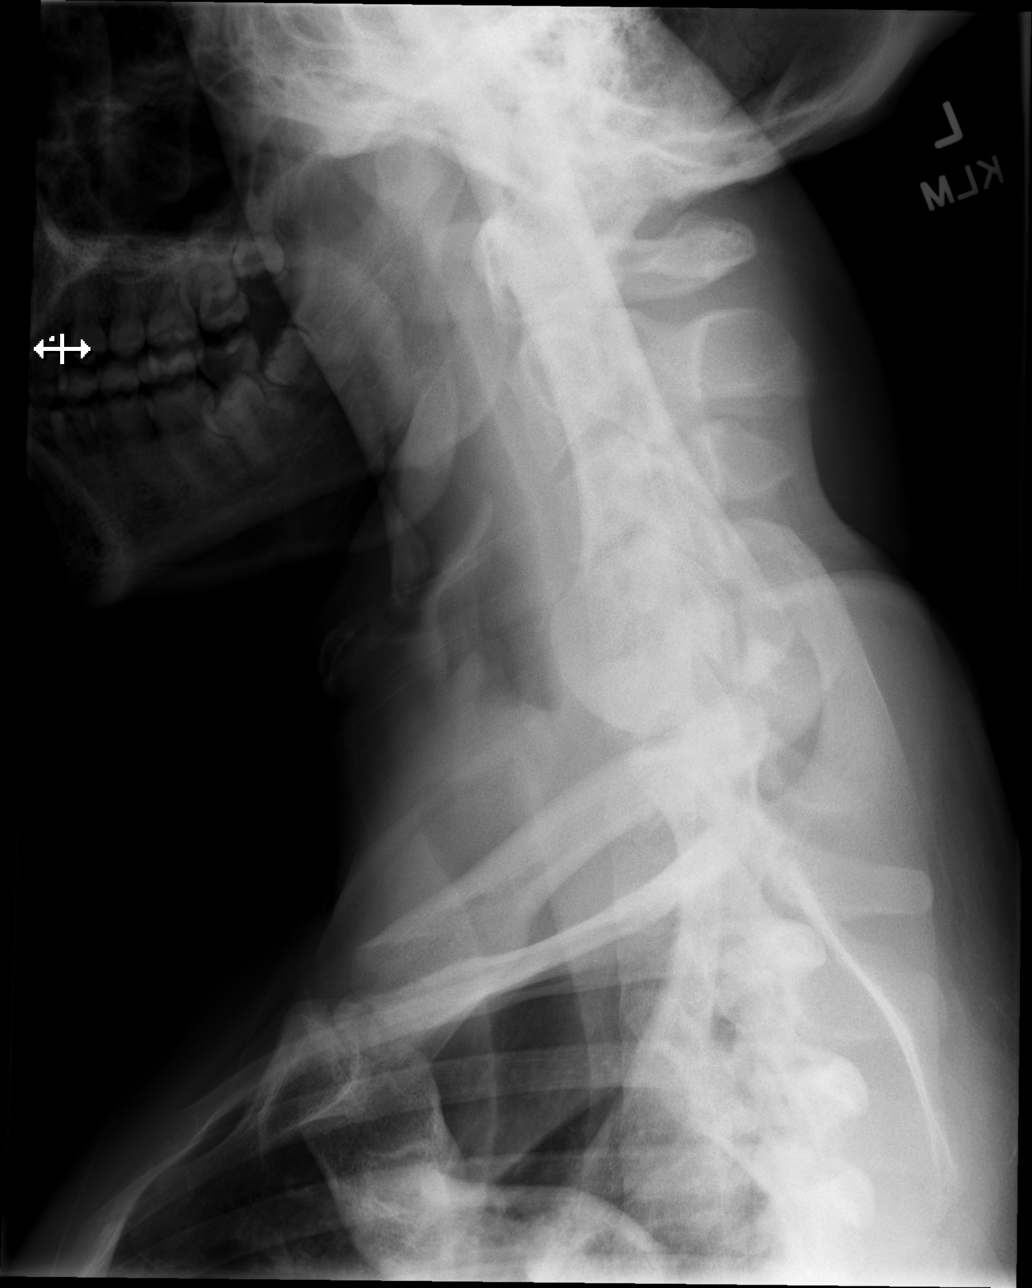

[w thoracic spine lat (1 of 2)]
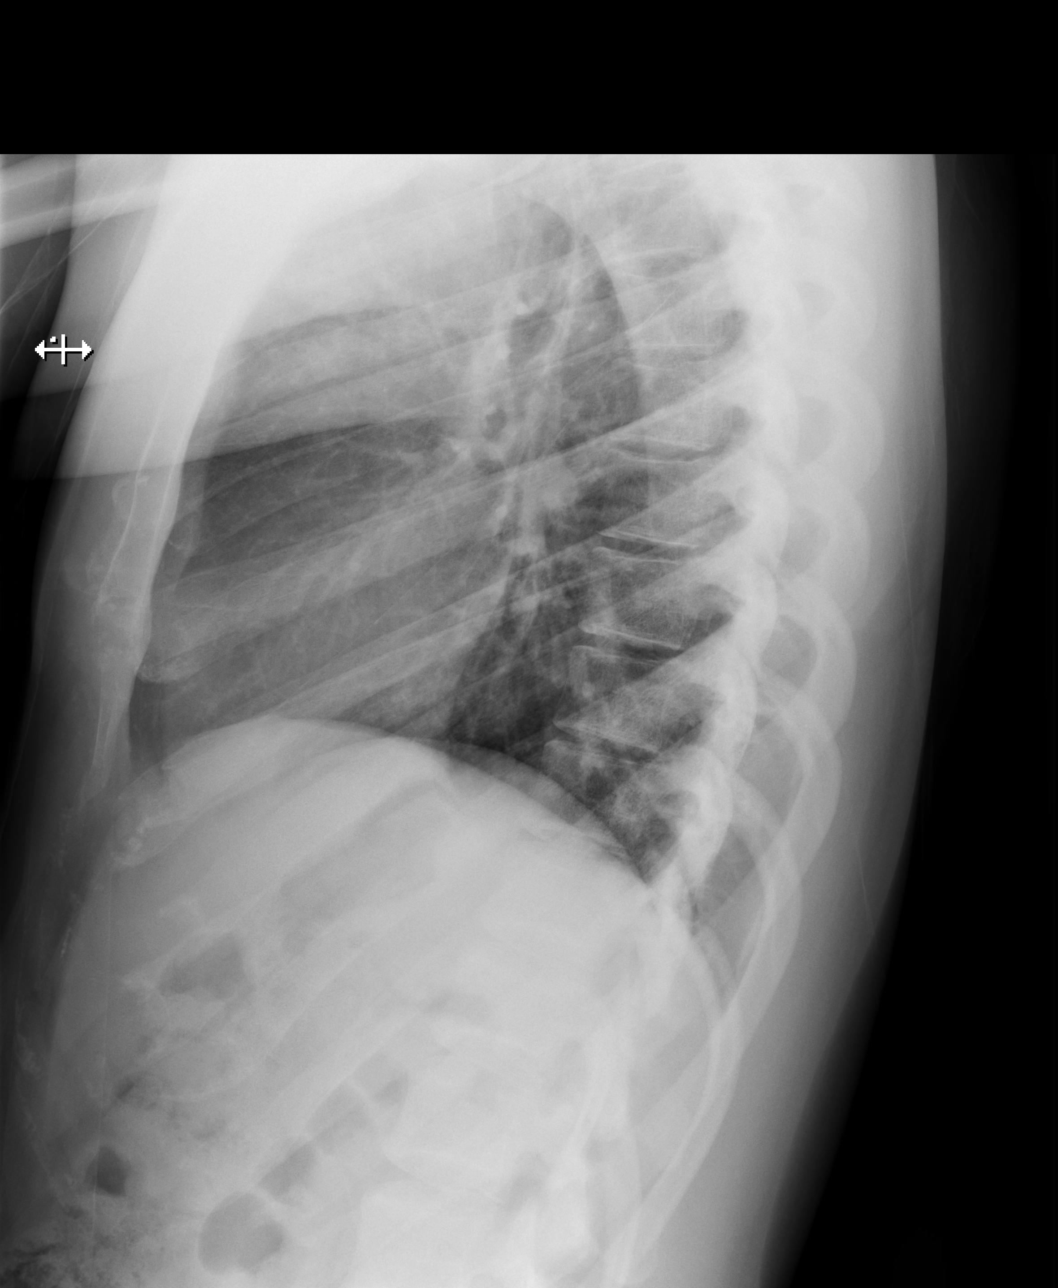

[w thoracic spine lat (2 of 2)]
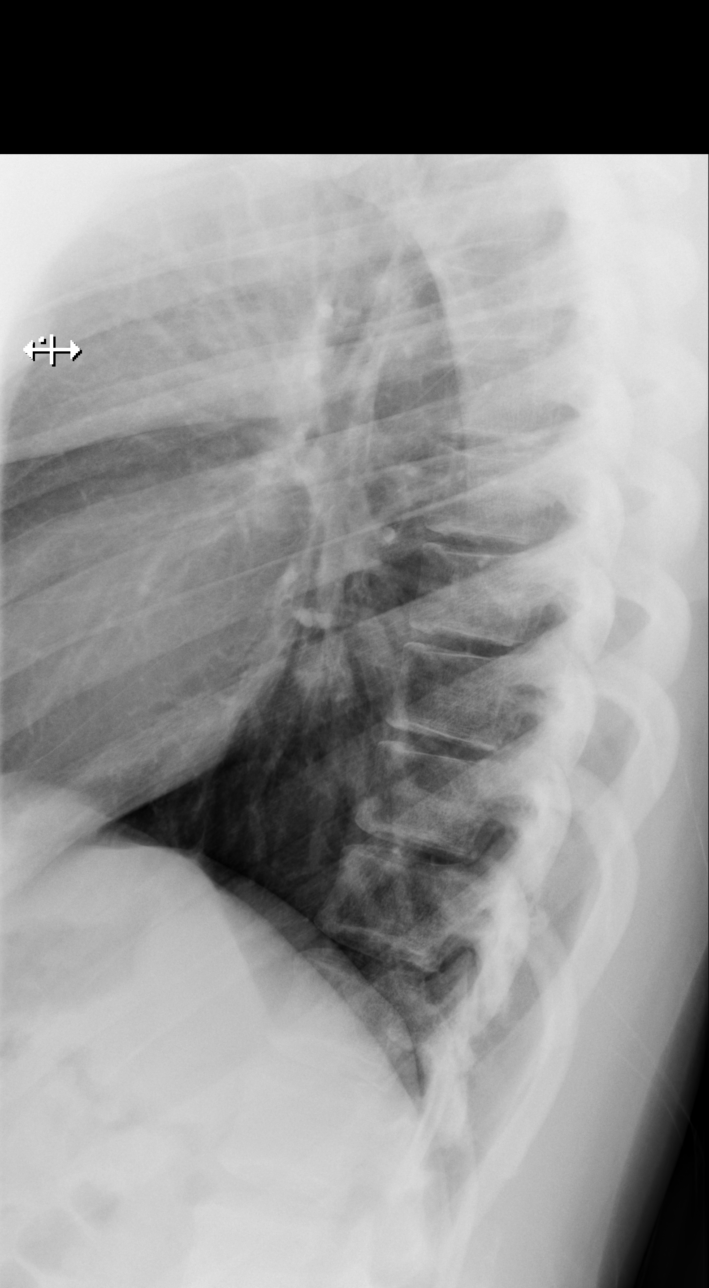

[w thoracic spine ap]
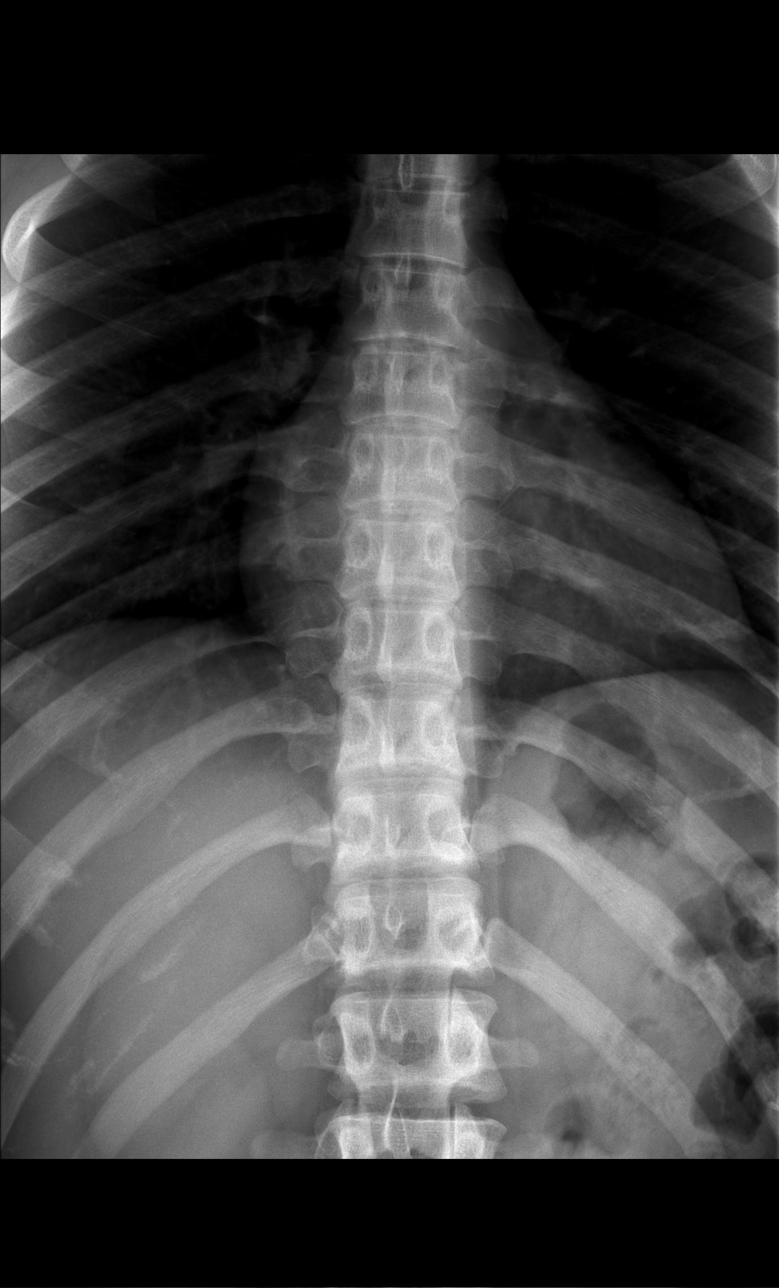

[4 of 4 positions shown; findings below may reference images not displayed]

FINDINGS: Twelve rib-bearing thoracic vertebral bodies.

No acute fracture or subluxation. Vertebral body heights are
preserved.

Alignment is normal. Intervertebral disc spaces are maintained.
IMPRESSION: Negative.
